# Patient Record
Sex: Male | Born: 1955
Health system: Southern US, Community
[De-identification: ages and names within clinical notes are randomized; demographics above are authoritative.]

## PROBLEM LIST (undated history)

## (undated) DIAGNOSIS — F419 Anxiety disorder, unspecified: Secondary | ICD-10-CM

## (undated) DIAGNOSIS — K219 Gastro-esophageal reflux disease without esophagitis: Secondary | ICD-10-CM

## (undated) DIAGNOSIS — I219 Acute myocardial infarction, unspecified: Secondary | ICD-10-CM

## (undated) DIAGNOSIS — F32A Depression, unspecified: Secondary | ICD-10-CM

## (undated) DIAGNOSIS — F329 Major depressive disorder, single episode, unspecified: Secondary | ICD-10-CM

## (undated) DIAGNOSIS — K635 Polyp of colon: Secondary | ICD-10-CM

## (undated) DIAGNOSIS — Z9889 Other specified postprocedural states: Secondary | ICD-10-CM

## (undated) DIAGNOSIS — I10 Essential (primary) hypertension: Secondary | ICD-10-CM

## (undated) DIAGNOSIS — E7800 Pure hypercholesterolemia, unspecified: Secondary | ICD-10-CM

## (undated) DIAGNOSIS — I2510 Atherosclerotic heart disease of native coronary artery without angina pectoris: Secondary | ICD-10-CM

## (undated) DIAGNOSIS — T7840XA Allergy, unspecified, initial encounter: Secondary | ICD-10-CM

## (undated) HISTORY — DX: Major depressive disorder, single episode, unspecified: F32.9

## (undated) HISTORY — DX: Gastro-esophageal reflux disease without esophagitis: K21.9

## (undated) HISTORY — DX: Anxiety disorder, unspecified: F41.9

## (undated) HISTORY — PX: LEG SURGERY: SHX1003

## (undated) HISTORY — PX: ANGIOPLASTY: SHX39

## (undated) HISTORY — DX: Polyp of colon: K63.5

## (undated) HISTORY — PX: CORONARY ARTERY BYPASS GRAFT: SHX141

## (undated) HISTORY — DX: Allergy, unspecified, initial encounter: T78.40XA

## (undated) HISTORY — DX: Acute myocardial infarction, unspecified: I21.9

## (undated) HISTORY — DX: Depression, unspecified: F32.A

---

## 2003-03-16 ENCOUNTER — Encounter: Payer: Self-pay | Admitting: Gastroenterology

## 2003-03-27 ENCOUNTER — Emergency Department (HOSPITAL_COMMUNITY): Admission: EM | Admit: 2003-03-27 | Discharge: 2003-03-27 | Payer: Self-pay | Admitting: Emergency Medicine

## 2003-08-10 ENCOUNTER — Encounter: Payer: Self-pay | Admitting: Gastroenterology

## 2004-06-28 ENCOUNTER — Emergency Department (HOSPITAL_COMMUNITY): Admission: EM | Admit: 2004-06-28 | Discharge: 2004-06-28 | Payer: Self-pay | Admitting: Emergency Medicine

## 2004-07-10 ENCOUNTER — Encounter: Admission: RE | Admit: 2004-07-10 | Discharge: 2004-07-10 | Payer: Self-pay | Admitting: Cardiology

## 2005-03-12 ENCOUNTER — Emergency Department (HOSPITAL_COMMUNITY): Admission: EM | Admit: 2005-03-12 | Discharge: 2005-03-13 | Payer: Self-pay | Admitting: Emergency Medicine

## 2005-03-14 ENCOUNTER — Emergency Department (HOSPITAL_COMMUNITY): Admission: EM | Admit: 2005-03-14 | Discharge: 2005-03-14 | Payer: Self-pay | Admitting: Emergency Medicine

## 2005-03-20 ENCOUNTER — Encounter (HOSPITAL_COMMUNITY): Admission: RE | Admit: 2005-03-20 | Discharge: 2005-06-18 | Payer: Self-pay | Admitting: Cardiology

## 2005-05-07 ENCOUNTER — Inpatient Hospital Stay (HOSPITAL_COMMUNITY): Admission: EM | Admit: 2005-05-07 | Discharge: 2005-05-09 | Payer: Self-pay | Admitting: Emergency Medicine

## 2005-06-10 ENCOUNTER — Observation Stay (HOSPITAL_COMMUNITY): Admission: RE | Admit: 2005-06-10 | Discharge: 2005-06-11 | Payer: Self-pay | Admitting: *Deleted

## 2005-06-25 ENCOUNTER — Encounter: Payer: Self-pay | Admitting: Emergency Medicine

## 2005-06-26 ENCOUNTER — Observation Stay (HOSPITAL_COMMUNITY): Admission: EM | Admit: 2005-06-26 | Discharge: 2005-06-27 | Payer: Self-pay | Admitting: Internal Medicine

## 2005-08-07 ENCOUNTER — Encounter (HOSPITAL_COMMUNITY): Admission: RE | Admit: 2005-08-07 | Discharge: 2005-11-05 | Payer: Self-pay | Admitting: Cardiology

## 2005-10-30 ENCOUNTER — Encounter (INDEPENDENT_AMBULATORY_CARE_PROVIDER_SITE_OTHER): Payer: Self-pay | Admitting: *Deleted

## 2005-10-30 ENCOUNTER — Ambulatory Visit (HOSPITAL_COMMUNITY): Admission: RE | Admit: 2005-10-30 | Discharge: 2005-10-30 | Payer: Self-pay | Admitting: *Deleted

## 2005-11-06 ENCOUNTER — Encounter (HOSPITAL_COMMUNITY): Admission: RE | Admit: 2005-11-06 | Discharge: 2005-12-22 | Payer: Self-pay | Admitting: Cardiology

## 2006-01-06 ENCOUNTER — Encounter (INDEPENDENT_AMBULATORY_CARE_PROVIDER_SITE_OTHER): Payer: Self-pay | Admitting: *Deleted

## 2006-01-06 ENCOUNTER — Ambulatory Visit (HOSPITAL_COMMUNITY): Admission: RE | Admit: 2006-01-06 | Discharge: 2006-01-06 | Payer: Self-pay | Admitting: *Deleted

## 2006-01-16 ENCOUNTER — Encounter: Admission: RE | Admit: 2006-01-16 | Discharge: 2006-01-16 | Payer: Self-pay | Admitting: Internal Medicine

## 2006-02-17 ENCOUNTER — Encounter (HOSPITAL_COMMUNITY): Admission: RE | Admit: 2006-02-17 | Discharge: 2006-04-27 | Payer: Self-pay | Admitting: Cardiology

## 2006-04-17 ENCOUNTER — Encounter: Admission: RE | Admit: 2006-04-17 | Discharge: 2006-04-17 | Payer: Self-pay | Admitting: Internal Medicine

## 2006-05-26 DIAGNOSIS — I219 Acute myocardial infarction, unspecified: Secondary | ICD-10-CM

## 2006-05-26 HISTORY — DX: Acute myocardial infarction, unspecified: I21.9

## 2006-10-29 ENCOUNTER — Encounter: Admission: RE | Admit: 2006-10-29 | Discharge: 2006-10-29 | Payer: Self-pay | Admitting: Internal Medicine

## 2007-02-04 ENCOUNTER — Encounter (INDEPENDENT_AMBULATORY_CARE_PROVIDER_SITE_OTHER): Payer: Self-pay | Admitting: Internal Medicine

## 2007-02-04 ENCOUNTER — Observation Stay (HOSPITAL_COMMUNITY): Admission: AD | Admit: 2007-02-04 | Discharge: 2007-02-05 | Payer: Self-pay | Admitting: Internal Medicine

## 2007-02-24 ENCOUNTER — Ambulatory Visit (HOSPITAL_COMMUNITY): Admission: RE | Admit: 2007-02-24 | Discharge: 2007-02-24 | Payer: Self-pay | Admitting: Internal Medicine

## 2008-07-19 ENCOUNTER — Encounter (INDEPENDENT_AMBULATORY_CARE_PROVIDER_SITE_OTHER): Payer: Self-pay | Admitting: *Deleted

## 2008-12-20 ENCOUNTER — Encounter (INDEPENDENT_AMBULATORY_CARE_PROVIDER_SITE_OTHER): Payer: Self-pay | Admitting: *Deleted

## 2008-12-20 ENCOUNTER — Encounter: Payer: Self-pay | Admitting: Gastroenterology

## 2009-02-16 ENCOUNTER — Ambulatory Visit: Payer: Self-pay | Admitting: Gastroenterology

## 2009-02-20 ENCOUNTER — Encounter: Payer: Self-pay | Admitting: Gastroenterology

## 2009-02-20 ENCOUNTER — Telehealth (INDEPENDENT_AMBULATORY_CARE_PROVIDER_SITE_OTHER): Payer: Self-pay | Admitting: *Deleted

## 2009-02-28 ENCOUNTER — Telehealth (INDEPENDENT_AMBULATORY_CARE_PROVIDER_SITE_OTHER): Payer: Self-pay | Admitting: *Deleted

## 2009-03-05 ENCOUNTER — Encounter: Payer: Self-pay | Admitting: Gastroenterology

## 2009-03-15 ENCOUNTER — Telehealth (INDEPENDENT_AMBULATORY_CARE_PROVIDER_SITE_OTHER): Payer: Self-pay | Admitting: *Deleted

## 2009-03-22 ENCOUNTER — Encounter: Payer: Self-pay | Admitting: Gastroenterology

## 2009-03-22 ENCOUNTER — Ambulatory Visit (HOSPITAL_COMMUNITY): Admission: RE | Admit: 2009-03-22 | Discharge: 2009-03-22 | Payer: Self-pay | Admitting: Gastroenterology

## 2009-03-27 ENCOUNTER — Encounter: Payer: Self-pay | Admitting: Gastroenterology

## 2009-03-28 ENCOUNTER — Ambulatory Visit: Payer: Self-pay | Admitting: Gastroenterology

## 2009-12-07 ENCOUNTER — Telehealth: Payer: Self-pay | Admitting: Gastroenterology

## 2010-06-16 ENCOUNTER — Encounter: Payer: Self-pay | Admitting: Internal Medicine

## 2010-06-16 ENCOUNTER — Encounter: Payer: Self-pay | Admitting: Cardiology

## 2010-06-25 NOTE — Progress Notes (Signed)
Summary: Schedule colonoscopy  Phone Note Outgoing Call Call back at Home Phone [phone]   Call placed by: Harlow Mares CMA Duncan Dull),  December 07, 2009 4:57 PM Call placed to: Patient Summary of Call: Patients number is disconnected, we will mail them a letter to remind them they are due for their procedure and they need to call back and schedule. patient needs office visit before colonoscopy due to plavix  Initial call taken by: Harlow Mares CMA Duncan Dull),  December 07, 2009 4:57 PM

## 2010-10-08 NOTE — H&P (Signed)
[name], [name] NO.:  [id number]   MEDICAL RECORD NO.:  [id number]          PATIENT TYPE:  EMS   LOCATION:  ED                           FACILITY:  [facility]   PHYSICIAN:  Elliot Cousin, M.D.    DATE OF BIRTH:  [birth year]   DATE OF ADMISSION:  02/03/2007  DATE OF DISCHARGE:                              HISTORY & PHYSICAL   PRIMARY CARE PHYSICIAN:  Jaclyn Prime. [name], M.D.   CHIEF COMPLAINT:  I have a certain feeling in my chest.  The patient  also complains of mild shortness of breath and questionable  palpitations.   HISTORY OF PRESENT ILLNESS:  The patient is a 55 year old man with a  past medical history significant for coronary artery disease, status  post three-vessel CABG in August 2006, gastroesophageal reflux disease,  and generalized anxiety.  He presents to the emergency department with  the chief complaint of a certain feeling in my chest.  The patient  says that it is difficult to describe the feeling in his chest.  He says  that the discomfort is not like when he suffered a heart attack in  August of 2006.  The sensation is a discomfort and not an outright pain.  It is not a pressure-like pain.  It is associated with mild shortness of  breath and what he believes may be palpitations.  The discomfort is  located substernally and it does not radiate.  It is not associated with  nausea, vomiting, diaphoresis, pleurisy, or radiation.  He has had these  episodes intermittently over the past week.  He admits to an increase in  stress at home and work.  He also confirms that he has heartburn  although he says that these episodes do not feel as if they are  heartburn.  The patient denies any illicit drug use.  He denies  excessive caffeine intake.   During the evaluation in the emergency department, the patient is noted  to be hemodynamically stable.  His EKG reveals normal sinus rhythm with  a heart rate of 80 beats per minute and no acute ST-T wave  abnormalities.  His initial cardiac markers are negative.  His chest x-  ray reveals no active disease.  However, given his risk factors, he will  be admitted for further evaluation and management.   PAST MEDICAL HISTORY:  1. Coronary artery disease with a history of three-vessel CABG in      August of 2006.  2. Status post angioplasty and stent to the saphenous vein graft to      the obtuse marginal in January 2007 by Dr. Jenne Campus.  3. Gastroesophageal reflux disease.  4. Dyslipidemia.  5. History of hypotension secondary to medication.  6. Depression.  7. Anxiety.  8. Umbilicated nodule of the gastric body (reactive gastropathy) per      EGD by Dr. Virginia Rochester in June 2007.  9. Ascending colon polyp and internal hemorrhoids per colonoscopy in      August 2007 by Dr. Virginia Rochester.   MEDICATIONS:  1. Plavix 75 mg daily.  2. Lipitor 20 mg daily.  3. Prevacid 30 mg daily.  4. Bisoprolol fumarate 2.5 mg daily.  5. Isosorbide mononitrate ER 30 mg daily.  6. Xanax 0.5 mg as needed.  7. Multivitamin once daily.  8. Aspirin 325 mg daily.  9. Clonazepam 0.5 mg daily.  10.Nitroglycerin as needed.  11.Lexapro 10 mg daily.   ALLERGIES:  No known drug allergies.   SOCIAL HISTORY:  The patient is divorced.  [place]  He has two children.  He is employed by [employer].  He  stopped smoking in 2006.  He denies alcohol and illicit drug use.   FAMILY HISTORY:  His mother is 78 years of age and has no chronic  medical conditions.  His father died of melanoma in his 73s.  He has two  brothers who have coronary artery disease.   REVIEW OF SYSTEMS:  The patient's review of systems is positive an  increase in stress, heartburn; otherwise review of systems is negative.   REVIEW OF SYSTEMS:  The patient's review of systems is otherwise  negative.   PHYSICAL EXAMINATION:  VITAL SIGNS:  Temperature 96.7, blood pressure  94/61, pulse 73, respiratory rate 18, oxygen saturation 100%  on room  air.  GENERAL:  The patient is a pleasant, 55 year old, alert Caucasian male  who is currently sitting up in bed in no acute distress.  HEENT:  Head is normocephalic, atraumatic.  Pupils are equal, round and  reactive to light.  Extraocular movements are intact.  Conjunctivae  clear.  Sclerae white.  Nasal mucosa is mildly dry.  Oropharynx reveals  mildly dry mucous membranes.  No posterior exudates or erythema.  NECK:  Supple.  No adenopathy.  No thyromegaly.  No bruits.  No JVD.  LUNGS:  Clear to auscultation bilaterally.  HEART:  S1 and S2.  No murmurs, rubs or gallops.  CHEST:  Chest wall has a well-healed sternotomy scar, nontender.  ABDOMEN:  Positive bowel sounds.  Soft, nontender, nondistended.  No  hepatosplenomegaly.  No masses palpated.  EXTREMITIES:  The patient's pedal pulses are 2+ bilaterally.  No  pretibial edema.  No pedal edema.  NEUROLOGIC:  The patient is alert and oriented x3.  Cranial nerves II-  XII intact.  Strength is 5/5 throughout.  Sensation is intact.  PSYCHOLOGICAL:  The patient is alert and oriented.  He is cooperative.  His affect is pleasant.  No signs of anxiety currently.   ADMISSION LABORATORY DATA:  BNP less than 30.  CK-MB less than 1.  Troponin-I less than 0.05, myoglobin 5.8.  sodium 139, potassium 3.9,  chloride 105, CO2 28, glucose 92, BUN 9, creatinine 0.72.  Total  bilirubin 1.9, alkaline phosphatase 93,  SGOT 20, SGPT 25, total protein  7.2, albumin 4, calcium 9.2.  D-dimer less than 0.22.  WBC 10.4,  hemoglobin 14.7, platelets 185.   ASSESSMENT:  Chest discomfort with associated shortness of breath and  possible palpitations.  Given the symptomatology, the patient's  presentation is less likely secondary to an acute coronary event.  Will  consider an increase in anxiety as it relates to increased  stress.  The  patient's EKG is within normal limits.  His chest x-ray reveals no  active disease.  His initial cardiac markers are  negative.  However,  given his extensive cardiac history, he will be admitted to rule out  myocardial infarction.   PLAN:  1. The patient will be admitted for further evaluation and management.  2. Will continue cardiac  medications.  However, will hold the beta      blocker for a systolic blood pressure less than or equal to 95 and      a heart rate less than 58.  3. Will continue chronic anxiety medications.  4. For further evaluation, will check cardiac enzymes, TSH, and a 2-D      echocardiogram.  Will also check a followup EKG in the morning.      Elliot Cousin, M.D.  Electronically Signed     DF/MEDQ  D:  02/04/2007  T:  02/04/2007  Job:  [id number]   cc:   Jaclyn Prime. [name], M.D.  Fax: [phone]

## 2010-10-11 NOTE — Discharge Summary (Signed)
[name], [name]               ACCOUNT NO.:  [id number]   MEDICAL RECORD NO.:  [id number]          PATIENT TYPE:  INP   LOCATION:  3738                         FACILITY:  [facility]   PHYSICIAN:  Hillery Aldo, M.D.   DATE OF BIRTH:  09-05-1955   DATE OF ADMISSION:  06/26/2005  DATE OF DISCHARGE:  06/27/2005                                 DISCHARGE SUMMARY   PRIMARY CARE PHYSICIAN:  Antionette Char, MD.   Please send a priority fax of this Discharge Summary to Dr. Adelene Idler  office.   DISCHARGE DIAGNOSES:  1.  Atypical chest pain.  2.  History of coronary artery disease status post coronary artery bypass      graft and recent percutaneous transluminal coronary angioplasty and      stenting.  3.  Dyslipidemia.  4.  Hypertension.  5.  Gastroesophageal reflux disease.  6.  Anxiety.   DISCHARGE MEDICATIONS:  1.  Multivitamin daily.  2.  Aspirin 325 mg daily.  3.  Lipitor 20 mg daily.  4.  Prilosec OTC 20 mg daily.  5.  Bisoprolol 2.5 mg daily.  6.  Plavix 75 mg daily.  7.  Propranolol 20 mg p.r.n.  8.  Vicodin 5/500 1-2 q.6 hours p.r.n. (#20 dispensed with no refills).   CONSULTATIONS:  Telephone consultation made with Dr. Aleen Campi and Dr.  Jenne Campus.   PROCEDURES AND DIAGNOSTIC STUDIES:  Chest x-ray on June 25, 2005, showed  no acute findings.  The heart size and mediastinal contours were within  normal limits.  Both lungs were clear.   DISCHARGE LABORATORY VALUES:  Cardiac markers were negative x3.  C. reactive  protein was 0.9.  ESR was 11.  D-dimer was less than 0.22.  BNP was less  than 30.   BRIEF ADMISSION HPI:  The patient is a 55 year old male who has a past  medical history of coronary artery disease status post three vessel bypass  in August, 2006, and a recent PTCA with stenting done approximately 2 weeks  ago by Dr. Jenne Campus.  He presents with very atypical chest pain described as  occurring in short intermittent spurts and being sharp in nature.  There  was  no association with nausea, vomiting, diaphoresis or shortness of breath.  He was admitted for acute MI rule out.   HOSPITAL COURSE BY PROBLEM:  1.  Atypical chest pain:  The patient did have intermittent chest pains      while here in the hospital.  An EKG was obtained during one of these      episodes which did not reveal any ST or T wave changes.  All of his      cardiac markers were negative.  There was no evidence of acute pulmonary      embolism given his normal D-dimer.  There is no evidence of an      inflammatory process such as pericarditis, given his normal D-dimer.      There is no evidence of an inflammatory process such as pericarditis,      given his normal CRP and ESR.  The patient  was reassured that his chest      pain was not likely due to cardiac issues and was encouraged to followup      with an appointment with Mer[facility] on Monday,      June 30, 2005, at 10:30 a.m.  He will be given a prescription for      Vicodin to use p.r.n. for chest pain, which is likely secondary to      musculoskeletal issues.  2.  Hyperlipidemia.  The patient was continued on his usual dose of Statin.  3.  Anxiety:  The patient did display some anxious behavior during the      course of his hospitalization.  If this persists in being a problem, he      might benefit from a psychiatric referral.   DISPOSITION:  The patient will be discharged home.  Followup has been  arranged.  He is instructed to consume a heart healthy diet and to resume  his activities as tolerated.   CONDITION:  The patient's condition at discharge is improved.           ______________________________  Hillery Aldo, M.D.     CR/MEDQ  D:  06/27/2005  T:  06/27/2005  Job:  [id number]

## 2010-10-11 NOTE — Op Note (Signed)
NAMESUHAAN, [name]               ACCOUNT NO.:  [id number]   MEDICAL RECORD NO.:  [id number]          PATIENT TYPE:  AMB   LOCATION:  ENDO                         FACILITY:  [facility]   PHYSICIAN:  Georgiana Spinner, M.D.    DATE OF BIRTH:  1955-11-26   DATE OF PROCEDURE:  DATE OF DISCHARGE:                                 OPERATIVE REPORT   PROCEDURE:  Colonoscopy.   INDICATIONS:  Colon polyp, colon cancer screening.   ANESTHESIA:  Demerol 90 mg, Versed 10 mg.   PROCEDURE:  With the patient mildly sedated in the left lateral decubitus  position, a rectal exam was performed which was unremarkable.  Subsequently  the Olympus videoscopic colonoscope was inserted the rectum and passed under  direct vision to the cecum identified by ileocecal valve and appendiceal  orifice both of which were photographed.  From this point the colonoscope  was slowly withdrawn taking circumferential views of colonic mucosa stopping  one fold removed from the ileocecal valve.  It appeared thickened to me and  may well been normal but I elected to biopsy just to make sure it was not an  adenomatous polyp and from this point, as stated, the colonoscope was then  withdrawn taking circumferential views remaining colonic mucosa stopping in  the rectum which appeared normal on direct and showed hemorrhoids on  retroflexed view the endoscope was straightened, withdrawn.  The patient's  vital signs, pulse oximeter remained stable.  The patient tolerated  procedure well without apparent complications.   FINDINGS:  Question of polyp in the ascending colon biopsied.  Internal  hemorrhoids otherwise unremarkable colonoscopic examination to the cecum.   PLAN:  Await biopsy report.  The patient will call me for results and follow-  up with me as an outpatient.           ______________________________  Georgiana Spinner, M.D.     GMO/MEDQ  D:  01/06/2006  T:  01/06/2006  Job:  [id number]

## 2010-10-11 NOTE — H&P (Signed)
NAMEARTUR, [name]               ACCOUNT NO.:  [id number]   MEDICAL RECORD NO.:  [id number]          PATIENT TYPE:  INP   LOCATION:  3711                         FACILITY:  [facility]   PHYSICIAN:  Hillery Aldo, M.D.   DATE OF BIRTH:  1955/08/18   DATE OF ADMISSION:  05/07/2005  DATE OF DISCHARGE:                                HISTORY & PHYSICAL   PRIMARY CARE PHYSICIAN:  Dr. [name].   CHIEF COMPLAINTS:  Chest pain.   HISTORY OF PRESENT ILLNESS:  The patient is a 55 year old male with a past  medical history of coronary artery disease status post three-vessel CABG who  has been involved in cardiac rehabilitation. The patient also has a history  of tachycardia and takes propranolol p.r.n. for palpitations. The patient  reported that he had palpitations yesterday and took propranolol, and, when  he went to the cardiac rehab facility today, he was given less of a workout  than normal due to having to take propranolol the previous day. The patient  felt fine during his workout and developed left-sided pressure-type chest  pain while driving home. We reported the chest tightness as being 7/10. He  then decided to drive back to the rehabilitation facility, and, while there,  was given sublingual nitroglycerin x3 which brought the chest tightness down  to a level 1 out of 10. His initial blood pressure during that episode was  124/80 and this came down to 102/70 after the third nitroglycerin. The  patient denies any radiation of the pain, diaphoresis, nausea, or other  symptoms. He did get mildly short of breath but stated that the chest  tightness made him somewhat anxious.   ALLERGIES:  No known drug allergies.   MEDICATIONS:  1.  Lipitor 20 milligrams daily.  2.  Digoxin 0.125 milligrams daily.  3.  Propranolol 20 milligrams p.r.n. tachycardia.  4.  Multivitamin daily.  5.  Zantac 75 milligrams daily.  6.  Aspirin 325 milligrams daily.   PAST MEDICAL HISTORY:  1.  Coronary  artery disease status post three-vessel CABG in August of 2006.  2.  Dyslipidemia.  3.  Gastroesophageal reflux disease status post EGD with dilatation of the      esophagus approximately 2 years ago.   FAMILY HISTORY:  The patient has a mother who is alive at age 79 and  healthy. Father is deceased at age 4 from complications of skin cancer. He  has two brothers that experienced early MIs. He has a sister who is healthy.   SOCIAL HISTORY:  Patient is divorced.  He previously worked for [employer] as  a Merchandiser, retail, but has not worked since his bypass surgery. He is a former  smoker, having quit at the time of bypass in August of 2006. Prior to this,  he smoked a third to a quarter pack a day for 10 years. Denies any alcohol  since the surgery. No drugs.   REVIEW OF SYSTEMS:  No fever, chills, or recent weight changes. No  dysphagia. Has had mild shortness of breath with episodes described above.  No cough. Chest pain  as noted above. No palpitations today. No change in  bowel habits, melena or hematochezia. No dysuria. He reports occasional  headaches.  He has had diminished energy since his bypass surgery.   PHYSICAL EXAMINATION:  VITAL SIGNS:  Temperature 97.3, pulse 80,  respirations 16, blood pressure 114/60.  GENERAL:  Well-developed, well-nourished male in no acute distress.  HEENT: Normocephalic and atraumatic.  Pupils equal, round and reactive to  light.  Extraocular movements intact.  Oropharynx clear.  NECK:  Supple.  No thyromegaly, no lymphadenopathy, no jugular venous  distension.  CHEST:  Lungs were clear to auscultation bilaterally with good air movement.  HEART:  Regular rate and rhythm. No murmurs, rubs or gallops.  ABDOMEN:  Soft, nontender, distended. Normoactive bowel sounds.  EXTREMITIES:  No clubbing, edema or cyanosis.  SKIN:  Warm and dry. No rashes.   EKG:  No ST changes. He has normal sinus rhythm.   CHEST X-RAY:  No acute findings.   LABORATORY DATA:   CBC shows a white blood cell count of 7.9, hemoglobin  16.1, hematocrit 45.4 and platelets 214. Sodium 140, potassium 4.1, chloride  108, bicarbonate 26, BUN 9, creatinine 0.8, glucose 97. Point care markers  were negative x2.   ASSESSMENT/PLAN:  1.  Chest pain.  The patient has significant risk factors for an acute      coronary event, given his recent coronary artery bypass surgery. We will      admit the patient and rule out acute myocardial infarction with serial      enzymes and EKG.  If his enzymes are negative, it would be reasonable to      set him up for follow-up with his cardiologist, Dr. [name]. No evidence      of pulmonary pathology on chest x-ray. We will also check a thyroid      stimulating hormone level to rule out hyperthyroidism.  2.  Hyperlipidemia. Will check a fasting lipid panel in the morning to be      sure he is appropriately managed on his current dose of Lipitor.  3.  Gastroesophageal reflux disease.  Continue the patient on proton pump      inhibitor.  4.  Prophylaxis.  Will use PAS hose for deep vein thrombosis prophylaxis.           ______________________________  Hillery Aldo, M.D.     CR/MEDQ  D:  05/07/2005  T:  05/08/2005  Job:  [id number]   cc:   Jaclyn Prime. [name], M.D.  Fax: [phone]

## 2010-10-11 NOTE — Discharge Summary (Signed)
[name], [name] NO.:  [id number]   MEDICAL RECORD NO.:  [id number]          PATIENT TYPE:  INP   LOCATION:  3711                         FACILITY:  [facility]   PHYSICIAN:  Michaelyn Barter, M.D. DATE OF BIRTH:  1955-11-11   DATE OF ADMISSION:  05/07/2005  DATE OF DISCHARGE:  05/09/2005                                 DISCHARGE SUMMARY   PRIMARY CARE PHYSICIAN:  Jaclyn Prime. [name], M.D.   FINAL DIAGNOSES:  1.  Chest pain.  2.  Dyslipidemia.   CONSULTATIONS:  Cardiology with Dr. Aggie Cosier.   PROCEDURES:  X-ray of chest, May 07, 2005.   HISTORY OF PRESENT ILLNESS:  [name] is a 55 year old gentleman who  arrived with the chief complaint of chest pain.  He stated that he had  experienced palpitations the day prior to this admission and took  propranolol.  He participates in cardiac rehabilitation and went to the  cardiac rehab facility earlier the day of admission.  His workout had been  decreased secondary to taking propranolol the previous day.  He was able to  complete his workout without any complications, however, while driving home  he experienced left sided chest pressure.  The chest tightness was described  as a 7 out of 10 in intensity.  He turned around and returned to the  rehabilitation facility where he was provided sublingual nitroglycerin X3.  His chest discomfort decreased to a 1 out of 10.  Likewise, his blood  pressure also decreased.  The patient denied having any radiation of the  pain, no diaphoresis, no nausea.  There was some mild shortness of breath  that accompanied the episode.   PAST MEDICAL HISTORY:  For past medical history please see that dictated by  Dr. Lonia Blood on May 07, 2005.   HOSPITAL COURSE:  PROBLEM #1:  CHEST PAIN:  The patient was admitted to rule  out acute coronary syndrome.  He had an x-ray completed on May 07, 2005  with the final impression there was no acute disease.  Given his extensive  cardiac history, cardiology was consulted and Dr. [name] saw the patient on  May 08, 2005.  His note indicated that he was going to plan for a  catheterization in his office next week and later in the day the patient was  discharged home.  On the date of discharge, May 09, 2005 he stated that  his chest pain had declined to 2 out of 10.  He denied having any additional  complaints.   PROBLEM #2:  DYSLIPIDEMIA:  This was monitored over the course of the  patient's hospitalization.   PROBLEM #3:  HYPOTENSION:  It was noted that the patient's blood pressure  was on the low side on May 08, 2005 at 89/51 and by May 09, 2005  it was 93/59.  It was felt that the patient may have been slightly volume  depleted therefore fluids were provided to the patient.  The patient  otherwise was asymptomatic.   PLAN:  The plan was to discharge the patient home on May 09, 2005.  He  stated that his chest pain had completely resolved with no shortness of  breath and he said he felt ready to be discharged home.   DISCHARGE MEDICATIONS:  The patient was instructed to resume all of his  previously prescribed home medications.   FOLLOW UP:  He was instructed to follow up with Dr. Aggie Cosier within the  next week.      Michaelyn Barter, M.D.  Electronically Signed     OR/MEDQ  D:  07/22/2005  T:  07/23/2005  Job:  [id number]   cc:   Jaclyn Prime. [name], M.D.  Fax: [phone]

## 2010-10-11 NOTE — H&P (Signed)
NAMETYROME, DONATELLI NO.:  [id number]   MEDICAL RECORD NO.:  [id number]          PATIENT TYPE:  EMS   LOCATION:  ED                           FACILITY:  [facility]   PHYSICIAN:  Hettie Holstein, D.O.    DATE OF BIRTH:  02-28-1956   DATE OF ADMISSION:  06/25/2005  DATE OF DISCHARGE:                                HISTORY & PHYSICAL   PRIMARY CARE PHYSICIAN:  Antionette Char, M.D.   CARDIOLOGIST:  Recent cardiologist, Dr. Jenne Campus, who placed a stent and  performed PTCA on Mr. Davies.   CHIEF COMPLAINT:  Chest pain.   HISTORY OF PRESENT ILLNESS:  [name] is a pleasant Caucasian male who has  recently undergone stenting and PTCA a couple of weeks ago per Dr. Jenne Campus.  He  has known history of three-vessel coronary artery bypass grafting in  August 2006 and it was discovered he had stent placement in the proximal  vein graft to the obtuse marginal.  There was noted patent SVG to the RCA  and he had been doing fairly well.  But around 4:30 he developed some sharp  pains in his left parasternal area where these have persisted and there was  no pleuritic component to this.  It was non-reproducible and an area of  soreness just anterior to his sternum.  There was no shortness of breath  accompanied his complaint of dyspnea on exertion though he has not exerted  himself since his surgery in August.   In the emergency department he had negative point-of-care markers.  An EKG  was not revealing with acute ischemia.   PAST MEDICAL HISTORY:  1.  CABG x3 in August 2006, status post PTCA and stenting two weeks ago.  2.  History of diabetes.  3.  Hypercholesterolemia.  4.  Hypertension.  5.  Reflux disease.  6.  He has undergone endoscopic evaluation by [facility] for which he      reports only nausea and reflux subsequent to these.   MEDICATIONS:  1.  Plavix 75 mg daily.  2.  Aspirin 325 mg daily.  3.  Propranolol on a p.r.n. basis.  4.  Lipitor 20 mg daily.  5.   Prilosec 20 mg daily.  6.  __________ 2.5 mg daily recently decreased.   ALLERGIES:  No known drug allergies.   SOCIAL HISTORY:  The patient quit tobacco in August.  Does not drink  alcohol.  He is divorced.  Does have children.  Currently off work.   FAMILY HISTORY:  Noncontributory.   REVIEW OF SYSTEMS:  Unremarkable.   PHYSICAL EXAMINATION:  VITAL SIGNS:  Vital signs were stable though slightly  on the low side.  However, he was asymptomatic.  HEENT:  Normocephalic, atraumatic.  Extraocular muscles were intact.  NECK:  Supple, nontender.  CHEST:  Anterior chest wall he does have wire that are palpable status post  CABG.  LUNGS:  Clear to auscultation.  ABDOMEN:  Soft, nontender.  EXTREMITIES:  No edema.  NEUROLOGIC:  The patient is euthymic.  His affect is stable.   LABORATORY DATA:  Point-of-care markers negative.  EKG revealed normal sinus  rhythm with nonspecific ST abnormalities.  Sodium 139, potassium 3.6. BUN  10, creatinine 0.8, glucose 107.  Chest x-ray:  I cannot review this right  now as I cannot access this on the pacs system.   ASSESSMENT:  1.  Atypical chest pain, status post stent percutaneous transluminal      coronary angioplasty two weeks ago.  2.  Known coronary artery disease.  3.  Hypertension.  4.  Gastroesophageal reflux disease.   PLAN:  We will admit Mr. Pryce for 23-hour observation and follow his  clinical course.  Continue with aspirin and most of the medication that he  was on at home and cycle his cardiac markers.  If these are clear, he could  certainly follow up with his primary cardiologist, Dr. Aleen Campi, upon  discharge.      Hettie Holstein, D.O.  Electronically Signed     ESS/MEDQ  D:  06/26/2005  T:  06/26/2005  Job:  [id number]   cc:   Antionette Char, MD  Fax: [phone]   Darlin Priestly, MD  Fax: [phone]

## 2010-10-11 NOTE — Cardiovascular Report (Signed)
NAMEANTARIO, YASUDA               ACCOUNT NO.:  [id number]   MEDICAL RECORD NO.:  [id number]          PATIENT TYPE:  OIB   LOCATION:  6529                         FACILITY:  [facility]   PHYSICIAN:  Darlin Priestly, MD  DATE OF BIRTH:  September 20, 1955   DATE OF PROCEDURE:  06/10/2005  DATE OF DISCHARGE:                              CARDIAC CATHETERIZATION   PROCEDURE:  1.  Saphenous vein graft angiography.  2.  Saphenous vein graft to obtuse marginal - proximal:      1.  Percutaneous transluminal coronary balloon angioplasty.      2.  Placement of intracoronary stent.   ATTENDING PHYSICIAN:  Darlin Priestly, M.D.   COMPLICATIONS:  None.   INDICATIONS FOR PROCEDURE:  Mr. Ly is a 55 year old male patient of Dr.  Charolette Child and Dr. [name] with a history of hypertension, history of  tobacco use, history of coronary artery bypass surgery in August 2006 at [facility] in [place] consisting of a LIMA to LAD, vein graft to  OM, and vein graft to RCA.  The patient had recurrent chest pain with an  abnormal stress test on May 15, 2005, with subsequent cardiac  catheterization by Dr. Charolette Child revealing an atretic LIMA to the LAD,  though the LAD, itself, had non-critical disease.  He did have diffuse  disease of the circumflex system with a patent vein graft to the OM, though  the vein graft was noted to have diffuse 95% proximal stenosis into the  early mid segment.  There was no disease distal to graft insertion.  The RCA  was noted to be totally occluded in its mid portion.  The vein graft to the  RCA was not visualized.  The patient is now brought for percutaneous  intervention of the vein graft to the OM.   DESCRIPTION OF PROCEDURE:  After giving informed written consent, the  patient was brought to the cardiac cath lab were the right and left groins  were shaved, prepped and draped in a sterile fashion.  ECG monitoring was  established.  Using modified  Seldinger technique, a 6 French arterial sheath  was inserted in the right femoral artery.  6 French diagnostic catheters  were used to perform diagnostic angiography.  An RCB catheter was used to  engage the saphenous vein graft to the RCA which was then visualized.  This  graft revealed 50% early mid as well as 40% distal stenosis inserted in the  distal portion of the RCA.  There did not appear to be any significant  disease distal to the graft insertion.  This catheter was then exchanged for  a number 6 French LCB guiding catheter with sideholes.  Following this, a  0.014 ATW marker wire was advanced down the guiding catheter and used to  cross the proximal vein graft stenotic lesion.  Measurements were obtained.  The wire was then positioned in the distal limb without difficulty.  Following this, a Voyager 2.5 by 20 mm balloon was then used to cross the  stenotic lesion and three subsequent inflations to 8 atmospheres was  performed for a total of approximately 1 minute 30 seconds.  Follow up  angiogram revealed good luminal gain.  This balloon was then removed and a  Cypher 2.5 by 28 mm stent was then carefully positioned across the ostium of  the vein graft into the proximal portion of the graft.  The stent was then  deployed at 14 atmospheres for a total of 25 seconds.  A second inflation at  16 atmospheres was then performed for 15 seconds.  Follow up angiogram  revealed no evidence of dissection or thrombus with TIMI III flow to the  distal vessel.  This balloon was then exchanged for a Powersail 2.75 by 8 mm  balloon which was then positioned in the mid portion of the stent.  Three  inflations at 10, 12, and 14 atmospheres was performed throughout the mid,  proximal, and ostial portion of the stent for a total of approximately 1  minute 15 seconds.  Follow up angiogram revealed no evidence of dissection  or thrombus with TIMI III flow to the distal vessel.  IV Integrilin was  used  throughout the case.  IV doses of heparin were given to maintain the ACT  between 200 and 300.  Final orthogonal angiograms revealed less than 10%  residual stenosis in the vein graft to the OM with TIMI III flow to the  distal vessel.  At this point, we elected to conclude the procedure.  All  balloons, wires, and catheters were removed.  The hemostatic sheath was sewn  in place and the patient was transferred back to the ward in stable  condition.   CONCLUSION:  1.  Successful percutaneous transluminal coronary balloon angioplasty and      placement of a Cypher 2.5 by 28 mm stent in the proximal vein graft to      the OM ultimately post dilated to 2.8 mm.  2.  Patent saphenous vein graft to the right coronary artery with long 50%      mid vessel stenosis.  3.  Adjuvant use of Integrilin infusion.      Darlin Priestly, MD  Electronically Signed     RHM/MEDQ  D:  06/10/2005  T:  06/10/2005  Job:  [id number]   cc:   Jaclyn Prime. [name], M.D.  Fax: [phone]   Antionette Char, MD  Fax: [phone]

## 2010-10-11 NOTE — H&P (Signed)
NAMELENVILLE, HIBBERD NO.:  [id number]   MEDICAL RECORD NO.:  [id number]          PATIENT TYPE:  OIB   LOCATION:  2899                         FACILITY:  [facility]   PHYSICIAN:  Darlin Priestly, MD  DATE OF BIRTH:  25-Mar-1956   DATE OF ADMISSION:  06/10/2005  DATE OF DISCHARGE:                                HISTORY & PHYSICAL   HISTORY OF PRESENT ILLNESS:  Mr. [name] is a 55 year old male who is  followed by Dr. Aleen Campi and Dr. [name].  He has history of coronary disease.  He had bypass surgery in [place], [place] in August of 2006.  He  has had recurrent chest pain.  He was cathed at the heart center on May 29, 2005 after an abnormal Cardiolite study.  Catheterization revealed  occlusion of the vein graft to the intermediate, slow flow and small distal  LIMA to the LAD with bidirectional flow from the native vessel, and an EF of  60-70%.  Native coronaries showed diffusely diseased RCA with a 60-80%  stenosis, normal circumflex, 30-40% LAD.  The intermediate branch was  totally occluded.  Dr. Aleen Campi felt that the patient had 2 vessel coronary  disease with total occlusion of the large intermediate branch and critical  diffuse stenosis of the proximal and mid RCA.  He had vein graft disease  with total occlusion of the RCA vein graft and subtotal occlusion of the  intermediate branch vein graft.  The patient has small noncontributing LIMA  to the LAD.  The patient is admitted now for elective intervention. He has  been put on Plavix.  He has had not had recurrent chest pain.  He has been  out of work.   PAST MEDICAL HISTORY:  Remarkable for gastroesophageal reflux.  He has had  previous endoscopy and colonoscopy at [facility].  He denies any history of  diabetes.  He has had hypotension.  He is on a Statin medicine.   ALLERGIES:  He has no known drug allergies.   CURRENT MEDICATIONS:  1.  Lipitor 20 mg a day.  2.  Aspirin 325 mg a day.  3.   Plavix 75 mg a day.  4.  Bisoprolol 15 mg a day.   SOCIAL HISTORY:  He was a Merchandiser, retail for [employer].  He is divorced.  He is  an ex-smoker.  He has 2 children and one grandchild.   FAMILY HISTORY:  Two brothers have had MI's.   REVIEW OF SYSTEMS:  Essentially unremarkable except for noted above.  Please  see complete office note from 05/27/05, for complete details.  He has not  had GI bleeding or melena.  He has not had prostate problems.   PHYSICAL EXAMINATION:  VITAL SIGNS:  Blood pressure 100/60, pulse 58, O2  saturation 94 on room air.  Temperature 97.  GENERAL:  He is a well developed thin male in no acute distress.  HEENT:  Normocephalic.  Extraocular muscles are intact.  Sclerae is  nonicteric.  Lids and conjunctivae without normal limits.  NECK:  Without JVD, without bruits.  CHEST:  Clear to auscultation and percussion.  CARDIAC:  Regular rate and rhythm without obvious murmurs, rubs or gallops.  ABDOMEN:  Nontender.  No hepatosplenomegaly.  EXTREMITIES:  Without edema.  Distal pulses are intact.  NEUROLOGICAL:  Grossly intact.  He is awake, alert, oriented and  cooperative.  Moves all extremities without any deficits.   LABORATORIES:  June 06, 2005, essentially within normal limits with  normal BUN and creatinine and hematology profile.   IMPRESSION:  1.  Coronary disease as described above, status post bypass surgery in      August of 2006 now with graft failure.  2.  Treated hyperlipidemia.  3.  Ex-smoker.  4.  Hypotension.  5.  Gastroesophageal reflux.   PLAN:  The patient is admitted now for elective intervention by Dr. Leanne Chang.      Abelino Derrick, P.A.      Darlin Priestly, MD  Electronically Signed    LKK/MEDQ  D:  06/10/2005  T:  06/10/2005  Job:  [id number]   cc:   Antionette Char, MD  Fax: [phone]

## 2010-10-11 NOTE — Discharge Summary (Signed)
[name], [name]               ACCOUNT NO.:  [id number]   MEDICAL RECORD NO.:  [id number]           PATIENT TYPE:   LOCATION:                               FACILITY:  [facility]   PHYSICIAN:  Darlin Priestly, MD  DATE OF BIRTH:  [birth year]   DATE OF ADMISSION:  06/10/2005  DATE OF DISCHARGE:  06/11/2005                                 DISCHARGE SUMMARY   DISCHARGE DIAGNOSES:  1.  Coronary artery disease with normal Cardiolite.  2.  Coronary artery disease with bypass grafting in August 2006, now with      graft failure.  3.  Angioplasty and stent to the saphenous vein graft to the obtuse marginal      by Dr. Darlin Priestly.  4.  Hyperlipidemia.  5.  Hypotension with stopping of Zebeta.  6.  Gastroesophageal reflux disease.  7.  Ex-smoker.  8.  Thrombocytopenia.   CONDITION ON DISCHARGE:  Improved.   PROCEDURE:  A percutaneous transluminal coronary angiography and stent  deployment of the saphenous vein graft to the obtuse marginal by Dr. Lenise [name] on June 10, 2005.   DISCHARGE MEDICATIONS:  1.  Lipitor 20 mg, one q. evening.  2.  Enteric-coated aspirin 325 mg daily.  3.  Plavix 75 mg daily.  4.  Multivitamin daily.  5.  Nitroglycerin 1/150 sublingual p.r.n. chest pain.  6.  Bisoprolol for now due to low blood pressure.   DISCHARGE INSTRUCTIONS:  1.  A low-fat, low-salt diet.  2.  Wash the right groin catheterization site with soap and water.  Call if      any bleeding, swelling or drainage.  3.  See [name], Dr. Aram Candela [name]'s N.P. on June 25, 2005, at 10      a.m.   HISTORY OF PRESENT ILLNESS:  This 54 year old male followed by Dr. Aleen [name]  and Dr. Jaclyn [name]. [name] with a history of coronary disease.  He had bypass  grafting in [place], [place] in August 2006, and has had recurrent  chest pain.  He [facility] at [facility] on May 29, 2005,  after an abnormal Cardiolite study.  The catheterization revealed an  occlusion of  the vein graft to the intermediate, slow flow and small distal  LIMA to the LAD with bi-directional flow from the native vessel with an  ejection fraction of 60%-70%.  The native course showed diffusely diseased  RCA with 60%-80% stenosis, normal circumflex and 30%-40% LAD.  The  intermediate branch was totally occluded.  Dr. Aleen [name] felt that the  patient had two-vessel coronary artery disease with total occlusion of the  large intermediate branch and critical diffuse stenosis of the proximal and  mid-RCA and a vein graft disease with total occlusion of the RCA vein graft  with sub-total occlusion of the intermediate branch vein graft, with non-  contributing LIMA to the LAD.   PAST MEDICAL HISTORY:  As above, as well as remarkable for reflux disease.  [name] follows him per [name].  No diabetes.  He has had hypertension, and  he is treated  for hyperlipidemia.   ALLERGIES:  No known drug allergies.   OUTPATIENT MEDICATIONS:  1.  Lipitor 20 mg.  2.  Aspirin 325 mg.  3.  Plavix 75 mg.  4.  Zebeta 15 mg.   FAMILY HISTORY/SOCIAL HISTORY/REVIEW OF SYSTEMS:  See the H&P.   DISCHARGE PHYSICAL EXAMINATION:  VITAL SIGNS:  Blood pressure 92/48, pulse  58, respirations 18, temperature 98 degrees.  Oxygen saturation on room air  97%.  HEART:  A regular rate and rhythm.  LUNGS:  Clear.  ABDOMEN:  Positive bowel sounds.  EXTREMITIES:  No edema.  The right groin catheterization site without  hematoma.  Pedal pulses 2+.   LABORATORY DATA:  Post-procedure:  Hemoglobin 14.1, hematocrit 39.2,  platelets low at 130, which is stable, WBC's 6.6.  Chemistry:  Sodium 141,  potassium 3.9, BUN 7, creatinine 1, glucose 90.  Cardiac enzymes:  CK-MB 26  with MB of 0.4.  Negative for any damage.  He was in sinus bradycardia with  a heart rate in the 50's.   HOSPITAL COURSE:  The patient came in electively for PCI and underwent that  without problems, and drug-eluting stent Cypher was placed.  He was  seen the  next morning by Dr. Jenne [name] and was discharged home.   FOLLOWUP:  He will follow up with Dr. Aleen [name].      [name]. [name]      Darlin Priestly, MD  Electronically Signed    LRI/MEDQ  D:  07/17/2005  T:  07/18/2005  Job:  [id number]   cc:   [name] Char, MD  Fax: [phone]

## 2010-10-11 NOTE — Op Note (Signed)
[name], [name]               ACCOUNT NO.:  [id number]   MEDICAL RECORD NO.:  [id number]          PATIENT TYPE:  AMB   LOCATION:  ENDO                         [facility]AC[facility]   PHYSICIAN:  Georgiana Spinner, M.D.    DATE OF BIRTH:  08-12-1955   DATE OF PROCEDURE:  10/30/2005  DATE OF DISCHARGE:                                 OPERATIVE REPORT   PROCEDURE:  Upper endoscopy with biopsy.   INDICATIONS:  Gastroesophageal reflux disease.   ANESTHESIA:  Propofol   PROCEDURE:  With the patient mildly sedated in the left lateral decubitus  position, the Olympus videoscopic endoscope was inserted in the mouth and  passed under direct vision through the esophagus which appeared normal.  There was no evidence of Barrett's esophagus seen.  We entered into the  stomach.  The fundus appeared normal, however, in the body of the stomach  was an umbilicated nodule presumably a pancreatic rest. This was  photographed and biopsied.  We entered through the pylorus into the duodenal  bulb and second portion of the duodenum and both appeared normal.  From this  point, the endoscope was slowly withdrawn taking circumferential views of  the duodenal mucosa until the endoscope had been pulled back into the  stomach and placed in retroflexion to view the stomach from below. The  endoscope was straightened and withdrawn taking circumferential views of the  remaining gastric and esophageal mucosa.  The patient's vital signs and  pulse oximeter remained stable.  The patient tolerated the procedure well  without apparent complications.   FINDINGS:  Umbilicated nodule in the gastric body which is presumably a  pancreatic rest, biopsied, await biopsy report.  The patient will call me  for results and follow-up with me as an outpatient.           ______________________________  Georgiana Spinner, M.D.     GMO/MEDQ  D:  10/30/2005  T:  10/30/2005  Job:  [id number]

## 2011-03-07 LAB — B-NATRIURETIC PEPTIDE (CONVERTED LAB): Pro B Natriuretic peptide (BNP): <30

## 2011-03-07 LAB — DIFFERENTIAL
Basophils Absolute: 0
Basophils Relative: 0
Eosinophils Absolute: 0.1
Eosinophils Relative: 1
Lymphocytes Relative: 20
Lymphs Abs: 2
Monocytes Absolute: 1 — ABNORMAL HIGH
Monocytes Relative: 10
Neutro Abs: 7.2
Neutrophils Relative %: 69

## 2011-03-07 LAB — LIPID PANEL
Cholesterol: 130
HDL: 46
LDL Cholesterol: 71
Total CHOL/HDL Ratio: 2.8
Triglycerides: 64
VLDL: 13

## 2011-03-07 LAB — CBC
HCT: 41.4
Hemoglobin: 14.7
MCHC: 35.4
MCV: 93.7
Platelets: 185
RBC: 4.42
RDW: 12.2
WBC: 10.4

## 2011-03-07 LAB — CARDIAC PANEL(CRET KIN+CKTOT+MB+TROPI)
CK, MB: 0.8
CK, MB: 0.8
CK, MB: 1.1
Relative Index: INVALID
Relative Index: INVALID
Total CK: 33
Total CK: 41
Troponin I: 0.01
Troponin I: 0.02
Troponin I: 0.02

## 2011-03-07 LAB — COMPREHENSIVE METABOLIC PANEL
ALT: 25
AST: 20
Albumin: 4
Alkaline Phosphatase: 93
BUN: 9
CO2: 28
Calcium: 9.2
Chloride: 105
Creatinine, Ser: 0.72
GFR calc Af Amer: >60
GFR calc non Af Amer: >60
Glucose, Bld: 92
Potassium: 3.9
Sodium: 139
Total Bilirubin: 1.9 — ABNORMAL HIGH
Total Protein: 7.2

## 2011-03-07 LAB — TSH
TSH: 1.592
TSH: 1.013

## 2011-03-07 LAB — POCT CARDIAC MARKERS
CKMB, poc: <1 — ABNORMAL LOW
Myoglobin, poc: 45.8
Operator id: 4761
Troponin i, poc: <0.05

## 2011-03-07 LAB — D-DIMER, QUANTITATIVE: D-Dimer, Quant: <0.22

## 2011-03-07 LAB — TROPONIN I: Troponin I: 0.03

## 2011-03-07 LAB — CK TOTAL AND CKMB (NOT AT ARMC)
CK, MB: 1.3
Relative Index: INVALID
Total CK: 51

## 2011-08-16 ENCOUNTER — Other Ambulatory Visit: Payer: Self-pay

## 2011-08-16 ENCOUNTER — Encounter (HOSPITAL_COMMUNITY): Payer: Self-pay | Admitting: Emergency Medicine

## 2011-08-16 ENCOUNTER — Emergency Department (HOSPITAL_COMMUNITY)
Admission: EM | Admit: 2011-08-16 | Discharge: 2011-08-16 | Disposition: A | Discharge status: Home / Self Care | Payer: BC Managed Care – PPO | Attending: Emergency Medicine | Admitting: Emergency Medicine

## 2011-08-16 DIAGNOSIS — R232 Flushing: Secondary | ICD-10-CM | POA: Insufficient documentation

## 2011-08-16 DIAGNOSIS — I2510 Atherosclerotic heart disease of native coronary artery without angina pectoris: Secondary | ICD-10-CM | POA: Insufficient documentation

## 2011-08-16 DIAGNOSIS — R21 Rash and other nonspecific skin eruption: Secondary | ICD-10-CM

## 2011-08-16 HISTORY — DX: Atherosclerotic heart disease of native coronary artery without angina pectoris: I25.10

## 2011-08-16 LAB — URINALYSIS, ROUTINE W REFLEX MICROSCOPIC
Leukocytes, UA: NEGATIVE
Nitrite: NEGATIVE
Specific Gravity, Urine: 1.003 — ABNORMAL LOW (ref 1.005–1.030)
pH: 6.5 (ref 5.0–8.0)

## 2011-08-16 LAB — DIFFERENTIAL
Basophils Absolute: 0 10*3/uL (ref 0.0–0.1)
Basophils Relative: 0 % (ref 0–1)
Eosinophils Relative: 0 % (ref 0–5)
Monocytes Absolute: 1.4 10*3/uL — ABNORMAL HIGH (ref 0.1–1.0)
Neutro Abs: 8.4 10*3/uL — ABNORMAL HIGH (ref 1.7–7.7)

## 2011-08-16 LAB — CBC
HCT: 43.4 % (ref 39.0–52.0)
MCHC: 36.4 g/dL — ABNORMAL HIGH (ref 30.0–36.0)
RDW: 12 % (ref 11.5–15.5)

## 2011-08-16 LAB — BASIC METABOLIC PANEL
Calcium: 9.8 mg/dL (ref 8.4–10.5)
Chloride: 106 mEq/L (ref 96–112)
Creatinine, Ser: 0.61 mg/dL (ref 0.50–1.35)
GFR calc Af Amer: >90 mL/min (ref >90)

## 2011-08-16 LAB — HEPATIC FUNCTION PANEL
Indirect Bilirubin: 0.6 mg/dL (ref 0.3–0.9)
Total Protein: 7.2 g/dL (ref 6.0–8.3)

## 2011-08-16 NOTE — ED Notes (Signed)
MD at bedside. edp webb

## 2011-08-16 NOTE — ED Notes (Signed)
MD at bedside. 

## 2011-08-16 NOTE — ED Notes (Signed)
Pt reports a warming sensation to his face, denies pain

## 2011-08-16 NOTE — ED Notes (Signed)
Pt. Stated, I woke this am and I was flushed my face, chest, arms were all red.  I had a cortisone shot 2 days ago in my knee, but I've had that before.

## 2011-08-16 NOTE — Discharge Instructions (Signed)
Rash A rash is a change in the color or feel of your skin. There are many different types of rashes. You may have other problems along with your rash. HOME CARE  Avoid the thing that caused your rash.   Do not scratch your rash.   You may take cools baths to help stop itching.   Only take medicines as told by your doctor.   Keep all doctor visits as told.  GET HELP RIGHT AWAY IF:   Your pain, puffiness (swelling), or redness gets worse.   You have a fever.   You have new or severe problems.   You have body aches, watery poop (diarrhea), or you throw up (vomit).   Your rash is not better after 3 days.  MAKE SURE YOU:   Understand these instructions.   Will watch your condition.   Will get help right away if you are not doing well or get worse.  Document Released: 10/29/2007 Document Revised: 05/01/2011 Document Reviewed: 02/24/2011 [employer] Patient Information 2012 [employer]. 

## 2011-08-16 NOTE — ED Provider Notes (Signed)
History     CSN: [id number]  Arrival date & time 08/16/11  4098   First MD Initiated Contact with Patient 08/16/11 [phone]      Chief Complaint  Patient presents with  . Allergic Reaction    (Consider location/radiation/quality/duration/timing/severity/associated sxs/prior treatment) HPI  60yoM h/o CAD s/p CABG and subsequent stent placement, pw flushing. Patient is that he woke up this morning with both his face and his chest red in color. He noticed some redness in his arms as well. He states that he's had no history of similar. He states that his blood pressure was elevated this morning more than usual. He denies itching. He denies throat swelling, pain, muffled voice, shortness of breath. He denies new medications or exposures including lotions soaps detergents. There's been no prolonged exposure in the sun. Denies recent illness, fevers. No sick contacts. He had fish- tilapia- for dinner last night. No recent headache, dizziness, chest pain, shortness of breath, abdominal pain, nausea, vomiting, diarrhea. No recent FB in orifices. He went to work and was sent home.    Past Medical History  Diagnosis Date  . Coronary artery disease     Past Surgical History  Procedure Date  . Cardiac surgery     No family history on file.  History  Substance Use Topics  . Smoking status: Former Research scientist (life sciences)  . Smokeless tobacco: Not on file  . Alcohol Use: No      Review of Systems  All other systems reviewed and are negative.   except as noted HPI   Allergies  Review of patient's allergies indicates no known allergies.  Home Medications   Current Outpatient Rx  Name Route Sig Dispense Refill  . ASPIRIN EC 325 MG PO TBEC Oral Take 325 mg by mouth daily.    . ATORVASTATIN CALCIUM 20 MG PO TABS Oral Take 20 mg by mouth every evening.    Marland Kitchen BISOPROLOL-HYDROCHLOROTHIAZIDE 2.5-6.25 MG PO TABS Oral Take 1 tablet by mouth daily.    Marland Kitchen CLOPIDOGREL BISULFATE 75 MG PO TABS Oral Take 75 mg by  mouth daily.    . ADULT MULTIVITAMIN W/MINERALS CH Oral Take 1 tablet by mouth daily.    Marland Kitchen NITROGLYCERIN 0.4 MG SL SUBL Sublingual Place 0.4 mg under the tongue every 5 (five) minutes as needed. For chest pain    . SERTRALINE HCL 50 MG PO TABS Oral Take 25 mg by mouth at bedtime.      BP 100/55  Pulse 64  Temp(Src) 98.1 F (36.7 C) (Oral)  Resp 18  SpO2 96%  Physical Exam  Nursing note and vitals reviewed. Constitutional: He is oriented to person, place, and time. He appears well-developed and well-nourished. No distress.  HENT:  Head: Atraumatic.  Mouth/Throat: Oropharynx is clear and moist. No oropharyngeal exudate.       No oral lesions  Eyes: Conjunctivae are normal. Pupils are equal, round, and reactive to light.  Neck: Neck supple.  Cardiovascular: Normal rate, regular rhythm, normal heart sounds and intact distal pulses.  Exam reveals no gallop and no friction rub.   No murmur heard. Pulmonary/Chest: Effort normal. No stridor. No respiratory distress. He has no wheezes. He has no rales.  Abdominal: Soft. Bowel sounds are normal. There is no tenderness. There is no rebound and no guarding.  Musculoskeletal: Normal range of motion. He exhibits no edema and no tenderness.  Neurological: He is alert and oriented to person, place, and time.  Skin: Skin is warm and dry. There is  erythema.          Facial flushing, neck/ bilateral proximal ue erythema. No warmth. +blanching. No purpura. Negative nikolsky's. No ttp  Min on abd  Psychiatric: He has a normal mood and affect.   TRIAGE EKG:  Date: 08/16/2011  Rate: 66  Rhythm: normal sinus rhythm  QRS Axis: normal  Intervals: normal  ST/T Wave abnormalities: nonspecific T wave changes t wave inv V3, flattening aVL  Conduction Disutrbances:none  Narrative Interpretation:   Old EKG Reviewed: changes noted   ED Course  Procedures (including critical care time)  Labs Reviewed  CBC - Abnormal; Notable for the following:     WBC 11.3 (*)    MCHC 36.4 (*)    All other components within normal limits  DIFFERENTIAL - Abnormal; Notable for the following:    Neutro Abs 8.4 (*)    Monocytes Relative 13 (*)    Monocytes Absolute 1.4 (*)    All other components within normal limits  BASIC METABOLIC PANEL - Abnormal; Notable for the following:    Glucose, Bld 109 (*)    All other components within normal limits  URINALYSIS, ROUTINE W REFLEX MICROSCOPIC - Abnormal; Notable for the following:    Specific Gravity, Urine 1.003 (*)    All other components within normal limits  HEPATIC FUNCTION PANEL   No results found.   1. Rash and nonspecific skin eruption   2. Flushing      MDM  Asx flushing. No supporting clinical history to support pheochromocytoma, mastocytosis, carcinoid syndrome. No recent illness or exposures. No neurologic findings. Appearance not c/w Gerilyn Nestle Johnson/TEN, erythema multiforme. Will check labs. Likely have patient keep a flushing diary and f/u with his PMD at [place].  Pt without progression of sx x 2.5 hours in ED. Continues to remain well appearing and no progression of skin findings (no intervention in ED). Labs unremarkable. Offered CDU observation and declined. Given STRICT precautions for return to the ED, otherwise will f/u with his PMD on Monday for recheck.  Will keep diary in the meantime.        Blair Heys, MD 08/16/11 1102

## 2011-08-16 NOTE — ED Notes (Signed)
Pt reports waking this am w/flushed feeling starting at the face and radiating to pelvic region, pt received a cortisone shot Wednesday for chronic (L) knee pain, pt denies sob, chest pain, back pain, dizziness, change to medication, or new hygiene products. Pt reports receiving a cortisone shot last year w/no adverse reaction, pt also reports elevated BP this am, pt's normal sbp is 100's today was 150, pt face and upper torso is red. Pt ate tilapia and broccoli for dinner last night, hx of the same w/no allergic reaction.

## 2012-02-20 DIAGNOSIS — K3189 Other diseases of stomach and duodenum: Secondary | ICD-10-CM | POA: Insufficient documentation

## 2012-02-27 ENCOUNTER — Encounter: Payer: Self-pay | Admitting: Gastroenterology

## 2012-11-12 ENCOUNTER — Encounter: Payer: Self-pay | Admitting: Gastroenterology

## 2013-03-19 ENCOUNTER — Emergency Department (HOSPITAL_COMMUNITY)
Admission: EM | Admit: 2013-03-19 | Discharge: 2013-03-19 | Disposition: A | Discharge status: Home / Self Care | Payer: BC Managed Care – PPO | Attending: Emergency Medicine | Admitting: Emergency Medicine

## 2013-03-19 ENCOUNTER — Encounter (HOSPITAL_COMMUNITY): Payer: Self-pay | Admitting: Emergency Medicine

## 2013-03-19 ENCOUNTER — Emergency Department (HOSPITAL_COMMUNITY): Payer: BC Managed Care – PPO

## 2013-03-19 DIAGNOSIS — Z7902 Long term (current) use of antithrombotics/antiplatelets: Secondary | ICD-10-CM | POA: Insufficient documentation

## 2013-03-19 DIAGNOSIS — Z87891 Personal history of nicotine dependence: Secondary | ICD-10-CM | POA: Insufficient documentation

## 2013-03-19 DIAGNOSIS — I10 Essential (primary) hypertension: Secondary | ICD-10-CM | POA: Insufficient documentation

## 2013-03-19 DIAGNOSIS — R0989 Other specified symptoms and signs involving the circulatory and respiratory systems: Secondary | ICD-10-CM | POA: Insufficient documentation

## 2013-03-19 DIAGNOSIS — R0600 Dyspnea, unspecified: Secondary | ICD-10-CM

## 2013-03-19 DIAGNOSIS — Z7982 Long term (current) use of aspirin: Secondary | ICD-10-CM | POA: Insufficient documentation

## 2013-03-19 DIAGNOSIS — E7800 Pure hypercholesterolemia, unspecified: Secondary | ICD-10-CM | POA: Insufficient documentation

## 2013-03-19 DIAGNOSIS — I2510 Atherosclerotic heart disease of native coronary artery without angina pectoris: Secondary | ICD-10-CM | POA: Insufficient documentation

## 2013-03-19 DIAGNOSIS — R0609 Other forms of dyspnea: Secondary | ICD-10-CM | POA: Insufficient documentation

## 2013-03-19 DIAGNOSIS — Z79899 Other long term (current) drug therapy: Secondary | ICD-10-CM | POA: Insufficient documentation

## 2013-03-19 DIAGNOSIS — R42 Dizziness and giddiness: Secondary | ICD-10-CM | POA: Insufficient documentation

## 2013-03-19 HISTORY — DX: Pure hypercholesterolemia, unspecified: E78.00

## 2013-03-19 HISTORY — DX: Essential (primary) hypertension: I10

## 2013-03-19 HISTORY — DX: Other specified postprocedural states: Z98.890

## 2013-03-19 LAB — D-DIMER, QUANTITATIVE: D-Dimer, Quant: <0.27 ug/mL-FEU (ref 0.00–0.48)

## 2013-03-19 LAB — BASIC METABOLIC PANEL
Chloride: 101 mEq/L (ref 96–112)
GFR calc Af Amer: >90 mL/min (ref >90)
Potassium: 3.3 mEq/L — ABNORMAL LOW (ref 3.5–5.1)

## 2013-03-19 LAB — CBC
Hemoglobin: 16.1 g/dL (ref 13.0–17.0)
MCHC: 36.7 g/dL — ABNORMAL HIGH (ref 30.0–36.0)
RBC: 4.75 MIL/uL (ref 4.22–5.81)

## 2013-03-19 LAB — POCT I-STAT TROPONIN I: Troponin i, poc: 0 ng/mL (ref 0.00–0.08)

## 2013-03-19 LAB — PRO B NATRIURETIC PEPTIDE: Pro B Natriuretic peptide (BNP): 106.4 pg/mL (ref 0–125)

## 2013-03-19 NOTE — ED Notes (Addendum)
Pt reports he has been under emotional stress this past week then today while he was helping his daughter move he felt like he couldn't catch his breath. states he did not have chest pain but he is concerned because he had open heart surgery in the past and 'soemthing doesn't feel right." also c/o "feeling flushed."

## 2013-03-19 NOTE — ED Provider Notes (Signed)
Care assumed at the change of shift  pending a second Troponin which is neg. He has remained asymptomatic in the ED. Recommend close followup with Dr. Jacinto Halim who is his cardiologist.  Bonnita Levan. Bernette Mayers, MD 03/19/13 1610

## 2013-03-19 NOTE — ED Provider Notes (Signed)
CSN: [id number]     Arrival date & time 03/19/13  1244 History   First MD Initiated Contact with Patient 03/19/13 1253     Chief Complaint  Patient presents with  . Shortness of Breath   (Consider location/radiation/quality/duration/timing/severity/associated sxs/prior Treatment) HPI This 57 year old male is low risk for pulmonary embolism, he has no recent travel trauma or immobilization or history of DVT or PE and no cancer he states about once every 6 months he has a spell for several months of shortness breath otherwise unexplained it has never gone to see the doctor for that, he arrives today asymptomatic after having several minutes of shortness of breath at 1130 with some lightheadedness without vertigo, he had no chest pain no cough no fever he is not short of breath now he is no abdominal pain no bloody stools no syncope no trauma no other concerns. There is no treatment prior to arrival. He feels back to baseline now. Past Medical History  Diagnosis Date  . Coronary artery disease   . History of open heart surgery   . Hypertension   . Hypercholesteremia    Past Surgical History  Procedure Laterality Date  . Cardiac surgery     History reviewed. No pertinent family history. History  Substance Use Topics  . Smoking status: Former Games developer  . Smokeless tobacco: Not on file  . Alcohol Use: No    Review of Systems 10 Systems reviewed and are negative for acute change except as noted in the HPI. Allergies  Review of patient's allergies indicates no known allergies.  Home Medications   Current Outpatient Rx  Name  Route  Sig  Dispense  Refill  . aspirin 81 MG tablet   Oral   Take 81 mg by mouth daily.         Marland Kitchen atorvastatin (LIPITOR) 20 MG tablet   Oral   Take 20 mg by mouth every evening.         . bisoprolol-hydrochlorothiazide (ZIAC) 2.5-6.25 MG per tablet   Oral   Take 1 tablet by mouth daily.         . clopidogrel (PLAVIX) 75 MG tablet   Oral   Take 75  mg by mouth daily.         . Multiple Vitamin (MULITIVITAMIN WITH MINERALS) TABS   Oral   Take 1 tablet by mouth daily.         . nitroGLYCERIN (NITROSTAT) 0.4 MG SL tablet   Sublingual   Place 0.4 mg under the tongue every 5 (five) minutes as needed. For chest pain         . sertraline (ZOLOFT) 50 MG tablet   Oral   Take 25 mg by mouth at bedtime.          BP 110/80  Pulse 70  Temp(Src) 97.8 F (36.6 C) (Oral)  Resp 18  Ht 5\' 5"  (1.651 m)  Wt 160 lb (72.576 kg)  BMI 26.63 kg/m2  SpO2 94% Physical Exam  Nursing note and vitals reviewed. Constitutional:  Awake, alert, nontoxic appearance.  HENT:  Head: Atraumatic.  Eyes: Right eye exhibits no discharge. Left eye exhibits no discharge.  Neck: Neck supple.  Cardiovascular: Normal rate and regular rhythm.   No murmur heard. Pulmonary/Chest: Effort normal and breath sounds normal. No respiratory distress. He has no wheezes. He has no rales. He exhibits no tenderness.  Abdominal: Soft. Bowel sounds are normal. He exhibits no distension. There is no tenderness. There is no rebound  and no guarding.  Musculoskeletal: He exhibits no edema and no tenderness.  Baseline ROM, no obvious new focal weakness.  Neurological:  Mental status and motor strength appears baseline for patient and situation.  Skin: No rash noted.  Psychiatric: He has a normal mood and affect.    ED Course  Procedures (including critical care time) Labs Review Labs Reviewed  BASIC METABOLIC PANEL - Abnormal; Notable for the following:    Potassium 3.3 (*)    Glucose, Bld 114 (*)    All other components within normal limits  CBC - Abnormal; Notable for the following:    MCHC 36.7 (*)    All other components within normal limits  PRO B NATRIURETIC PEPTIDE  D-DIMER, QUANTITATIVE  POCT I-STAT TROPONIN I  POCT I-STAT TROPONIN I   Imaging Review No results found.  EKG Interpretation     Ventricular Rate:  77 PR Interval:  148 QRS  Duration: 132 QT Interval:  400 QTC Calculation: 452 R Axis:   98 Text Interpretation:  Sinus rhythm with Fusion complexes Right bundle branch block Compared to previous tracing Right bundle branch block NOW PRESENT            MDM   1. Dyspnea    I doubt any other EMC precluding discharge at this time including, but not necessarily limited to the following:PE, ACS.    Hurman Horn, MD 03/27/13 2156

## 2013-12-29 ENCOUNTER — Other Ambulatory Visit: Payer: Self-pay | Admitting: *Deleted

## 2013-12-29 DIAGNOSIS — R002 Palpitations: Secondary | ICD-10-CM

## 2013-12-30 ENCOUNTER — Encounter (INDEPENDENT_AMBULATORY_CARE_PROVIDER_SITE_OTHER): Payer: No Typology Code available for payment source

## 2013-12-30 ENCOUNTER — Encounter: Payer: Self-pay | Admitting: Radiology

## 2013-12-30 DIAGNOSIS — R002 Palpitations: Secondary | ICD-10-CM

## 2013-12-30 NOTE — Progress Notes (Signed)
[name], male   DOB: [birth year], 57 y.o.   MRN: [id number] E cardio 30 day monitor applied

## 2015-01-15 ENCOUNTER — Encounter (INDEPENDENT_AMBULATORY_CARE_PROVIDER_SITE_OTHER): Payer: Self-pay

## 2015-01-15 ENCOUNTER — Ambulatory Visit (INDEPENDENT_AMBULATORY_CARE_PROVIDER_SITE_OTHER): Payer: 59 | Admitting: Family

## 2015-01-15 ENCOUNTER — Encounter: Payer: Self-pay | Admitting: Family

## 2015-01-15 VITALS — BP 118/80 | HR 68 | Temp 98.1°F | Resp 18 | Ht 65.0 in | Wt 158.8 lb

## 2015-01-15 DIAGNOSIS — F329 Major depressive disorder, single episode, unspecified: Secondary | ICD-10-CM | POA: Diagnosis not present

## 2015-01-15 DIAGNOSIS — K219 Gastro-esophageal reflux disease without esophagitis: Secondary | ICD-10-CM | POA: Insufficient documentation

## 2015-01-15 DIAGNOSIS — F32A Depression, unspecified: Secondary | ICD-10-CM | POA: Insufficient documentation

## 2015-01-15 DIAGNOSIS — I10 Essential (primary) hypertension: Secondary | ICD-10-CM | POA: Diagnosis not present

## 2015-01-15 NOTE — Assessment & Plan Note (Signed)
Depression stable with current regimen of Zoloft. Takes medication as prescribed and denies adverse side effects. Denies suicidal ideation. Continue current dosage of Zoloft.

## 2015-01-15 NOTE — Patient Instructions (Addendum)
Thank you for choosing [facility].  Summary/Instructions:  Please continue to take your medications as prescribed.   Please follow up for a physical in April 2017.   Very nice to meet you!  Food Choices for Gastroesophageal Reflux Disease When you have gastroesophageal reflux disease (GERD), the foods you eat and your eating habits are very important. Choosing the right foods can help ease the discomfort of GERD. WHAT GENERAL GUIDELINES DO I NEED TO FOLLOW?  Choose fruits, vegetables, whole grains, low-fat dairy products, and low-fat meat, fish, and poultry.  Limit fats such as oils, salad dressings, butter, nuts, and avocado.  Keep a food diary to identify foods that cause symptoms.  Avoid foods that cause reflux. These may be different for different people.  Eat frequent small meals instead of three large meals each day.  Eat your meals slowly, in a relaxed setting.  Limit fried foods.  Cook foods using methods other than frying.  Avoid drinking alcohol.  Avoid drinking large amounts of liquids with your meals.  Avoid bending over or lying down until 2-3 hours after eating. WHAT FOODS ARE NOT RECOMMENDED? The following are some foods and drinks that may worsen your symptoms: Vegetables Tomatoes. Tomato juice. Tomato and spaghetti sauce. Chili peppers. Onion and garlic. Horseradish. Fruits Oranges, grapefruit, and lemon (fruit and juice). Meats High-fat meats, fish, and poultry. This includes hot dogs, ribs, ham, sausage, salami, and bacon. Dairy Whole milk and chocolate milk. Sour cream. Cream. Butter. Ice cream. Cream cheese.  Beverages Coffee and tea, with or without caffeine. Carbonated beverages or energy drinks. Condiments Hot sauce. Barbecue sauce.  Sweets/Desserts Chocolate and cocoa. Donuts. Peppermint and spearmint. Fats and Oils High-fat foods, including [place] fries and potato chips. Other Vinegar. Strong spices, such as black pepper, white  pepper, red pepper, cayenne, curry powder, cloves, ginger, and chili powder. The items listed above may not be a complete list of foods and beverages to avoid. Contact your dietitian for more information. Document Released: 05/12/2005 Document Revised: 05/17/2013 Document Reviewed: 03/16/2013 [facility] Patient Information 2015 [place], [place]. This information is not intended to replace advice given to you by your health care Daouda Lonzo. Make sure you discuss any questions you have with your health care Bradyn Vassey.

## 2015-01-15 NOTE — Assessment & Plan Note (Signed)
GERD is adequately controlled with current dosage of pantoprazole. Does describe increased amount of gas and pressure. Continue current dosage of pantoprazole. Provided information regarding food choices to reduce symptoms. Follow-up for worsening.

## 2015-01-15 NOTE — Progress Notes (Signed)
Subjective:    [name], male    DOB: 07/20/1955, 59 y.o.   MRN: [id number]  Chief Complaint  Patient presents with  . Establish Care    HPI:  [name] is a 59 y.o. male with a PMH of seasonal allergies, depression, GERD, hyperlipidemia, hypertension, and coronary artery disease who presents today in office visit to establish care.    1.) Hypertension - Currently maintained on bisoprolol-hydrochlorothiazide. Takes the medications as prescribed and denies adverse side effects. Does not currently take his blood pressure at home. Last eye exam was about 8-10 years ago exact time unknown.   BP Readings from Last 3 Encounters:  01/15/15 118/80  03/19/13 110/80  08/16/11 100/55    2.) GERD - Currently maintained on pantoprazole. Takes the medication as prescribed and denies adverse side effects. Continues to experience additional burping and some increased gas. Has recently lost about 10 pounds and has noticed some improvements.  3.) Depression/Anxiety - Currently maintained on Zoloft. Takes the medicaitons as prescribed and denies adverse side effects. Indicates that his depression is well controlled at this time and denies suicidal ideations.  No Known Allergies   Outpatient Prescriptions Prior to Visit  Medication Sig Dispense Refill  . aspirin 81 MG tablet Take 81 mg by mouth daily.    Marland Kitchen atorvastatin (LIPITOR) 20 MG tablet Take 80 mg by mouth every evening.     . bisoprolol-hydrochlorothiazide (ZIAC) 2.5-6.25 MG per tablet Take 1 tablet by mouth daily.    . clopidogrel (PLAVIX) 75 MG tablet Take 75 mg by mouth daily.    . Multiple Vitamin (MULITIVITAMIN WITH MINERALS) TABS Take 1 tablet by mouth daily.    . nitroGLYCERIN (NITROSTAT) 0.4 MG SL tablet Place 0.4 mg under the tongue every 5 (five) minutes as needed. For chest pain    . sertraline (ZOLOFT) 50 MG tablet Take 25 mg by mouth at bedtime.     No facility-administered medications prior to visit.      Past Medical History  Diagnosis Date  . Coronary artery disease   . History of open heart surgery   . Hypertension   . Hypercholesteremia   . Depression   . GERD (gastroesophageal reflux disease)   . Allergy      Past Surgical History  Procedure Laterality Date  . Cardiac surgery    . Coronary artery bypass graft      3x  . Leg surgery Left      Family History  Problem Relation Age of Onset  . Healthy Mother   . Skin cancer Father   . Healthy Maternal Grandmother   . Healthy Maternal Grandfather   . Healthy Paternal Grandmother   . Healthy Paternal Grandfather      Social History   Social History  . Marital Status: Divorced    Spouse Name: N/A  . Number of Children: 2  . 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 Smoking status: Former Smoker    Quit date: 05/27/2004  . Smokeless tobacco: Never Used  . Alcohol Use: No  . Drug Use: No  . Sexual Activity: Yes   Other Topics Concern  . Not on file   Social History Narrative   Fun: Football and anything sports related.   Denies religious beliefs effecting health care.     Review of Systems  Constitutional: Negative for diaphoresis.  Eyes:  Negative for changes in vision.   Respiratory: Negative for chest tightness and shortness of breath.   Cardiovascular: Negative for chest pain, palpitations and leg swelling.  Gastrointestinal: Negative for nausea, vomiting, abdominal pain, diarrhea and constipation.  Neurological: Negative for headaches.  Psychiatric/Behavioral: Negative for suicidal ideas, sleep disturbance and dysphoric mood. The patient is not nervous/anxious.       Objective:    BP 118/80 mmHg  Pulse 68  Temp(Src) 98.1 F (36.7 C) (Oral)  Resp 18  Ht 5\' 5"  (1.651 m)  Wt 158 lb 12.8 oz (72.031 kg)  BMI 26.43 kg/m2  SpO2 96% Nursing note and vital signs reviewed.  Physical Exam  Constitutional: He is oriented to  person, place, and time. He appears well-developed and well-nourished. No distress.  Cardiovascular: Normal rate, regular rhythm, normal heart sounds and intact distal pulses.   Pulmonary/Chest: Effort normal and breath sounds normal.  Neurological: He is alert and oriented to person, place, and time.  Skin: Skin is warm and dry.  Psychiatric: He has a normal mood and affect. His behavior is normal. Judgment and thought content normal.       Assessment & Plan:   Problem List Items Addressed This Visit      Cardiovascular and Mediastinum   Essential hypertension - Primary    Hypertension well controlled with current regimen of bisoprolol-hydrochlorothiazide and below goal of 140/90. Denies adverse side effects. Due for an eye exam which will be scheduled independently. Continue current dosage of bisoprolol-hydrochlorothiazide.        Digestive   GERD (gastroesophageal reflux disease)    GERD is adequately controlled with current dosage of pantoprazole. Does describe increased amount of gas and pressure. Continue current dosage of pantoprazole. Provided information regarding food choices to reduce symptoms. Follow-up for worsening.      Relevant Medications   pantoprazole (PROTONIX) 40 MG tablet     Other   Depression    Depression stable with current regimen of Zoloft. Takes medication as prescribed and denies adverse side effects. Denies suicidal ideation. Continue current dosage of Zoloft.

## 2015-01-15 NOTE — Progress Notes (Signed)
Pre visit review using our clinic review tool, if applicable. No additional management support is needed unless otherwise documented below in the visit note. 

## 2015-01-15 NOTE — Assessment & Plan Note (Signed)
Hypertension well controlled with current regimen of bisoprolol-hydrochlorothiazide and below goal of 140/90. Denies adverse side effects. Due for an eye exam which will be scheduled independently. Continue current dosage of bisoprolol-hydrochlorothiazide.

## 2015-06-22 ENCOUNTER — Ambulatory Visit (INDEPENDENT_AMBULATORY_CARE_PROVIDER_SITE_OTHER): Payer: BLUE CROSS/BLUE SHIELD | Admitting: Family

## 2015-06-22 ENCOUNTER — Encounter: Payer: Self-pay | Admitting: Family

## 2015-06-22 VITALS — BP 120/78 | HR 63 | Temp 97.7°F | Resp 16 | Ht 65.0 in | Wt 158.0 lb

## 2015-06-22 DIAGNOSIS — J019 Acute sinusitis, unspecified: Secondary | ICD-10-CM | POA: Insufficient documentation

## 2015-06-22 DIAGNOSIS — J0100 Acute maxillary sinusitis, unspecified: Secondary | ICD-10-CM

## 2015-06-22 MED ORDER — AMOXICILLIN-POT CLAVULANATE 875-125 MG PO TABS: 1.0000 | ORAL_TABLET | Freq: Two times a day (BID) | ORAL | Status: DC

## 2015-06-22 NOTE — Assessment & Plan Note (Signed)
Symptoms and exam consistent with acute bacterial sinusitis. Start Augmentin. Continue over-the-counter medications as needed for symptom relief and supportive care. Follow-up if symptoms worsen or fail to improve.

## 2015-06-22 NOTE — Progress Notes (Signed)
Subjective:    [name], male    DOB: 06-30-1955, 60 y.o.   MRN: [id number]  Chief Complaint  Patient presents with  . Dizziness    has had sinus congestion and thinks it is causing his dizziness x3 weeks, gotten worse over time    HPI:  [name] is a 60 y.o. male who  has a past medical history of Coronary artery disease; History of open heart surgery; Hypertension; Hypercholesteremia; Depression; GERD (gastroesophageal reflux disease); and Allergy. and presents today for an acute office visit.   This is a new problem. Associated symptom of sinus congestion and dizziness has been going on for about 2 weeks .Denies fever. Modifying factors include Tylenol for the headache. Denies any recent antibiotics.   No Known Allergies   Current Outpatient Prescriptions on File Prior to Visit  Medication Sig Dispense Refill  . aspirin 81 MG tablet Take 81 mg by mouth daily.    Marland Kitchen atorvastatin (LIPITOR) 20 MG tablet Take 80 mg by mouth every evening.     . bisoprolol-hydrochlorothiazide (ZIAC) 2.5-6.25 MG per tablet Take 1 tablet by mouth daily.    . clopidogrel (PLAVIX) 75 MG tablet Take 75 mg by mouth daily.    . Multiple Vitamin (MULITIVITAMIN WITH MINERALS) TABS Take 1 tablet by mouth daily.    . nitroGLYCERIN (NITROSTAT) 0.4 MG SL tablet Place 0.4 mg under the tongue every 5 (five) minutes as needed. For chest pain    . pantoprazole (PROTONIX) 40 MG tablet Take 40 mg by mouth daily.    . sertraline (ZOLOFT) 50 MG tablet Take 25 mg by mouth at bedtime.     No current facility-administered medications on file prior to visit.    Review of Systems  Constitutional: Negative for fever and chills.  HENT: Positive for congestion, ear pain, sinus pressure and tinnitus. Negative for sore throat.   Respiratory: Negative for cough, chest tightness and shortness of breath.   Neurological: Positive for headaches.      Objective:    BP 120/78 mmHg  Pulse 63  Temp(Src)  97.7 F (36.5 C) (Oral)  Resp 16  Ht 5\' 5"  (1.651 m)  Wt 158 lb (71.668 kg)  BMI 26.29 kg/m2  SpO2 97% Nursing note and vital signs reviewed.  Physical Exam  Constitutional: He is oriented to person, place, and time. He appears well-developed and well-nourished. No distress.  HENT:  Right Ear: Hearing, tympanic membrane, external ear and ear canal normal.  Left Ear: Hearing, tympanic membrane, external ear and ear canal normal.  Nose: Right sinus exhibits maxillary sinus tenderness. Right sinus exhibits no frontal sinus tenderness. Left sinus exhibits maxillary sinus tenderness. Left sinus exhibits no frontal sinus tenderness.  Mouth/Throat: Uvula is midline, oropharynx is clear and moist and mucous membranes are normal.  Tympanostomy tube noted in left ear  Cardiovascular: Normal rate, regular rhythm, normal heart sounds and intact distal pulses.   Pulmonary/Chest: Effort normal and breath sounds normal.  Neurological: He is alert and oriented to person, place, and time.  Skin: Skin is warm and dry.  Psychiatric: He has a normal mood and affect. His behavior is normal. Judgment and thought content normal.       Assessment & Plan:   Problem List Items Addressed This Visit      Respiratory   Acute sinusitis - Primary    Symptoms and exam consistent with acute bacterial sinusitis. Start Augmentin. Continue over-the-counter medications as needed for symptom relief  and supportive care. Follow-up if symptoms worsen or fail to improve.      Relevant Medications   amoxicillin-clavulanate (AUGMENTIN) 875-125 MG tablet

## 2015-06-22 NOTE — Progress Notes (Signed)
Pre visit review using our clinic review tool, if applicable. No additional management support is needed unless otherwise documented below in the visit note. 

## 2015-06-22 NOTE — Patient Instructions (Signed)
Thank you for choosing [facility].  Summary/Instructions:  Your prescription(s) have been submitted to your pharmacy or been printed and provided for you. Please take as directed and contact our office if you believe you are having problem(s) with the medication(s) or have any questions.  If your symptoms worsen or fail to improve, please contact our office for further instruction, or in case of emergency go directly to the emergency room at the closest medical facility.   General Recommendations:    Please drink plenty of fluids.  Get plenty of rest   Sleep in humidified air  Use saline nasal sprays  Netti pot   OTC Medications:  Decongestants - helps relieve congestion   Flonase (generic fluticasone) or Nasacort (generic triamcinolone) - please make sure to use the "cross-over" technique at a 45 degree angle towards the opposite eye as opposed to straight up the nasal passageway.   Sudafed (generic pseudoephedrine - Note this is the one that is available behind the pharmacy counter); Products with phenylephrine (-PE) may also be used but is often not as effective as pseudoephedrine.   If you have HIGH BLOOD PRESSURE - Coricidin HBP; AVOID any product that is -D as this contains pseudoephedrine which may increase your blood pressure.  Afrin (oxymetazoline) every 6-8 hours for up to 3 days.   Allergies - helps relieve runny nose, itchy eyes and sneezing   Claritin (generic loratidine), Allegra (fexofenidine), or Zyrtec (generic cyrterizine) for runny nose. These medications should not cause drowsiness.  Note - Benadryl (generic diphenhydramine) may be used however may cause drowsiness  Cough -   Delsym or Robitussin (generic dextromethorphan)  Expectorants - helps loosen mucus to ease removal   Mucinex (generic guaifenesin) as directed on the package.  Headaches / General Aches   Tylenol (generic acetaminophen) - DO NOT EXCEED 3 grams (3,000 mg) in a 24  hour time period  Advil/Motrin (generic ibuprofen)   Sore Throat -   Salt water gargle   Chloraseptic (generic benzocaine) spray or lozenges / Sucrets (generic dyclonine)    Sinusitis Sinusitis is redness, soreness, and inflammation of the paranasal sinuses. Paranasal sinuses are air pockets within the bones of your face (beneath the eyes, the middle of the forehead, or above the eyes). In healthy paranasal sinuses, mucus is able to drain out, and air is able to circulate through them by way of your nose. However, when your paranasal sinuses are inflamed, mucus and air can become trapped. This can allow bacteria and other germs to grow and cause infection. Sinusitis can develop quickly and last only a short time (acute) or continue over a long period (chronic). Sinusitis that lasts for more than 12 weeks is considered chronic.  CAUSES  Causes of sinusitis include:  Allergies.  Structural abnormalities, such as displacement of the cartilage that separates your nostrils (deviated septum), which can decrease the air flow through your nose and sinuses and affect sinus drainage.  Functional abnormalities, such as when the small hairs (cilia) that line your sinuses and help remove mucus do not work properly or are not present. SIGNS AND SYMPTOMS  Symptoms of acute and chronic sinusitis are the same. The primary symptoms are pain and pressure around the affected sinuses. Other symptoms include:  Upper toothache.  Earache.  Headache.  Bad breath.  Decreased sense of smell and taste.  A cough, which worsens when you are lying flat.  Fatigue.  Fever.  Thick drainage from your nose, which often is green and may   contain pus (purulent).  Swelling and warmth over the affected sinuses. DIAGNOSIS  Your health care provider will perform a physical exam. During the exam, your health care provider may:  Look in your nose for signs of abnormal growths in your nostrils (nasal  polyps).  Tap over the affected sinus to check for signs of infection.  View the inside of your sinuses (endoscopy) using an imaging device that has a light attached (endoscope). If your health care provider suspects that you have chronic sinusitis, one or more of the following tests may be recommended:  Allergy tests.  Nasal culture. A sample of mucus is taken from your nose, sent to a lab, and screened for bacteria.  Nasal cytology. A sample of mucus is taken from your nose and examined by your health care provider to determine if your sinusitis is related to an allergy. TREATMENT  Most cases of acute sinusitis are related to a viral infection and will resolve on their own within 10 days. Sometimes medicines are prescribed to help relieve symptoms (pain medicine, decongestants, nasal steroid sprays, or saline sprays).  However, for sinusitis related to a bacterial infection, your health care provider will prescribe antibiotic medicines. These are medicines that will help kill the bacteria causing the infection.  Rarely, sinusitis is caused by a fungal infection. In theses cases, your health care provider will prescribe antifungal medicine. For some cases of chronic sinusitis, surgery is needed. Generally, these are cases in which sinusitis recurs more than 3 times per year, despite other treatments. HOME CARE INSTRUCTIONS   Drink plenty of water. Water helps thin the mucus so your sinuses can drain more easily.  Use a humidifier.  Inhale steam 3 to 4 times a day (for example, sit in the bathroom with the shower running).  Apply a warm, moist washcloth to your face 3 to 4 times a day, or as directed by your health care provider.  Use saline nasal sprays to help moisten and clean your sinuses.  Take medicines only as directed by your health care provider.  If you were prescribed either an antibiotic or antifungal medicine, finish it all even if you start to feel better. SEEK IMMEDIATE  MEDICAL CARE IF:  You have increasing pain or severe headaches.  You have nausea, vomiting, or drowsiness.  You have swelling around your face.  You have vision problems.  You have a stiff neck.  You have difficulty breathing. MAKE SURE YOU:   Understand these instructions.  Will watch your condition.  Will get help right away if you are not doing well or get worse. Document Released: 05/12/2005 Document Revised: 09/26/2013 Document Reviewed: 05/27/2011 ExitCare Patient Information 2015 [employer]. This information is not intended to replace advice given to you by your health care provider. Make sure you discuss any questions you have with your health care provider.   

## 2015-07-09 ENCOUNTER — Telehealth: Payer: Self-pay | Admitting: Family

## 2015-07-09 NOTE — Telephone Encounter (Signed)
Patient states he recently seen Marya Amsler for a sinus infection.  Patient states he was told to call back if he wasn't doing any better.  Patient states he has actually gotten worse.  Patient is requesting a call back in regards.

## 2015-07-09 NOTE — Telephone Encounter (Signed)
Still having dizziness, nose bleeds, really bad nasal congestion, ringing in the ears. States that you suggested that he see ENT. Also is there anything else that can be sent in in the mean time? Please advise

## 2015-07-10 MED ORDER — LEVOFLOXACIN 500 MG PO TABS: 500.0000 mg | ORAL_TABLET | Freq: Every day | ORAL | Status: DC

## 2015-07-10 MED ORDER — PREDNISONE 20 MG PO TABS: 20.0000 mg | ORAL_TABLET | Freq: Two times a day (BID) | ORAL | Status: DC

## 2015-07-10 NOTE — Telephone Encounter (Signed)
Prednisone and levofloxacin sent to pharmacy.

## 2015-07-12 NOTE — Telephone Encounter (Signed)
Pt is aware.  

## 2016-10-06 ENCOUNTER — Encounter: Payer: Self-pay | Admitting: Gastroenterology

## 2016-11-10 ENCOUNTER — Ambulatory Visit (INDEPENDENT_AMBULATORY_CARE_PROVIDER_SITE_OTHER): Payer: BLUE CROSS/BLUE SHIELD | Admitting: Gastroenterology

## 2016-11-10 ENCOUNTER — Encounter: Payer: Self-pay | Admitting: Gastroenterology

## 2016-11-10 ENCOUNTER — Encounter (INDEPENDENT_AMBULATORY_CARE_PROVIDER_SITE_OTHER): Payer: Self-pay

## 2016-11-10 ENCOUNTER — Telehealth: Payer: Self-pay

## 2016-11-10 VITALS — BP 120/62 | HR 64 | Ht 64.0 in | Wt 156.0 lb

## 2016-11-10 DIAGNOSIS — Z1211 Encounter for screening for malignant neoplasm of colon: Secondary | ICD-10-CM | POA: Diagnosis not present

## 2016-11-10 MED ORDER — NA SULFATE-K SULFATE-MG SULF 17.5-3.13-1.6 GM/177ML PO SOLN: 1.0000 | Freq: Once | ORAL | 0 refills | Status: AC

## 2016-11-10 NOTE — Progress Notes (Signed)
HPI: This is a    whvery pleasant 61 year old man who was referred to me by Golden Circle, FNP  to evacolon cancer screening .    Chief complaint is routine risk for colon cancer screening  No Gi bleeding.  Feels his bowels are 'never normal.'  Can feel constipated at times.  Has tried some things but never fiber supplements.   Tried to stay hydrated.  Drinks a lot of coffee.  He's on plavix for CAD (CABG in 2007, stenting about a year later).  No FH of colon cancer  Old Data Reviewed:  Colonoscopy August 2007, Dr. Erskine Emery. Indications "Per [employer] Protocol: RK270623/762": Findings: a single subcentimeter colon polyp. This was hyperplastic on pathology.       Review of systems: Pertinent positive and negative review of systems were noted in the above HPI section. All other review negative.   Past Medical History:  Diagnosis Date  . Allergy   . Anxiety   . Colon polyps   . Coronary artery disease   . Depression   . GERD (gastroesophageal reflux disease)   . History of open heart surgery   . Hypercholesteremia   . Hypertension     Past Surgical History:  Procedure Laterality Date  . ANGIOPLASTY     stent placement  . CORONARY ARTERY BYPASS GRAFT     3x  . LEG SURGERY Left     Current Outpatient Prescriptions  Medication Sig Dispense Refill  . aspirin 81 MG tablet Take 81 mg by mouth daily.    Marland Kitchen atorvastatin (LIPITOR) 20 MG tablet Take 80 mg by mouth every evening.     . bisoprolol-hydrochlorothiazide (ZIAC) 2.5-6.25 MG per tablet Take 1 tablet by mouth daily.    . clopidogrel (PLAVIX) 75 MG tablet Take 75 mg by mouth daily.    . Multiple Vitamin (MULITIVITAMIN WITH MINERALS) TABS Take 1 tablet by mouth daily.    . pantoprazole (PROTONIX) 40 MG tablet Take 40 mg by mouth daily.    . sertraline (ZOLOFT) 50 MG tablet Take 25 mg by mouth at bedtime.    . nitroGLYCERIN (NITROSTAT) 0.4 MG SL tablet Place 0.4 mg under the tongue every 5 (five)  minutes as needed. For chest pain     No current facility-administered medications for this visit.     Allergies as of 11/10/2016  . (No Known Allergies)    Family History  Problem Relation Age of Onset  . Healthy Mother   . Skin cancer Father   . Healthy Maternal Grandmother   . Healthy Maternal Grandfather   . Healthy Paternal Grandmother   . Healthy Paternal Grandfather   . Heart disease Brother   . Heart disease Brother     Social History   Social History  . Marital status: Divorced    Spouse name: N/A  . Number of children: 2  . 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 Smoking status: Former Smoker    Quit date: 05/27/2004  . Smokeless tobacco: Never Used  . Alcohol use No  . Drug use: No  . Sexual activity: Yes   Other Topics Concern  . Not on file   Social History Narrative   Fun: Football and anything sports related.   Denies religious beliefs effecting health care.      Physical Exam: BP 120/62 (BP Location: Left Arm, Patient Position: Sitting, Cuff Size: Normal)  Pulse 64   Ht 5\' 4"  (1.626 m) Comment: height measured without shoes  Wt 156 lb (70.8 kg)   BMI 26.78 kg/m  Constitutional: generally well-appearing Psychiatric: alert and oriented x3 Eyes: extraocular movements intact Mouth: oral pharynx moist, no lesions Neck: supple no lymphadenopathy Cardiovascular: heart regular rate and rhythm Lungs: clear to auscultation bilaterally Abdomen: soft, nontender, nondistended, no obvious ascites, no peritoneal signs, normal bowel sounds Extremities: no lower extremity edema bilaterally Skin: no lesions on visible extremities   Assessment and plan: 61 y.o. male witroutine risk for colon cancer screening  he is on Plavix for coronary artery disease, remote cardiac stenting. He understands that this is a potent blood thinner puts him at increased risk for procedural related bleeding during  a colonoscopy and would prefer that he hold it for 5 days prior to colonoscopy. There is a small but real risk of holding the medicine such as stroke, thromboembolic events. We will communicate with his cardiologist for safety of holding that medicine for 5 days and we'll plan for colonoscopy several weeks from now for routine risk screening. I see no reason for any further blood tests or imaging studies prior to then.    Please see the "Patient Instructions" section for addition details about the plan.   Owens Loffler, MD [facility] 11/10/2016, 10:32 AM  Cc: Golden Circle, FNP

## 2016-11-10 NOTE — Patient Instructions (Addendum)
You will be set up for a colonoscopy for screening.  We will communicate with Dr. Nadyne Coombes about the safety of holding your plavix for 5 days prior to the colonoscopy to decrease the risk of procedure related serious bleeding.  Normal BMI (Body Mass Index- based on height and weight) is between 19 and 25. Your BMI today is Body mass index is 26.78 kg/m. Marland Kitchen Please consider follow up  regarding your BMI with your Primary Care Provider.

## 2016-11-10 NOTE — Telephone Encounter (Signed)
11/10/2016   RE: [name] DOB: 1955-07-16 MRN: [id number]   Dear Dr Einar Gip,    We have scheduled the above patient for an endoscopic procedure. Our records show that he is on anticoagulation therapy.   Please advise as to how long the patient may come off his therapy of Plavix  prior to the procedure.  Please fax back to Katina Degree  at [phone].   Sincerely,    Jeoffrey Massed RN

## 2016-11-11 NOTE — Telephone Encounter (Signed)
The pt was advised that he can hold plavix 5 days prior to the procedure and verbalized understanding

## 2016-11-11 NOTE — Telephone Encounter (Signed)
Left message on machine to call back  

## 2016-12-05 ENCOUNTER — Encounter: Payer: Self-pay | Admitting: Gastroenterology

## 2016-12-05 ENCOUNTER — Ambulatory Visit (AMBULATORY_SURGERY_CENTER): Payer: BLUE CROSS/BLUE SHIELD | Admitting: Gastroenterology

## 2016-12-05 VITALS — BP 102/56 | HR 57 | Temp 97.8°F | Resp 12 | Ht 64.0 in | Wt 156.0 lb

## 2016-12-05 DIAGNOSIS — Z1211 Encounter for screening for malignant neoplasm of colon: Secondary | ICD-10-CM

## 2016-12-05 DIAGNOSIS — D124 Benign neoplasm of descending colon: Secondary | ICD-10-CM | POA: Diagnosis not present

## 2016-12-05 DIAGNOSIS — K635 Polyp of colon: Secondary | ICD-10-CM

## 2016-12-05 DIAGNOSIS — Z1212 Encounter for screening for malignant neoplasm of rectum: Secondary | ICD-10-CM

## 2016-12-05 MED ORDER — SODIUM CHLORIDE 0.9 % IV SOLN: 500.0000 mL | INTRAVENOUS | Status: AC

## 2016-12-05 NOTE — Progress Notes (Signed)
Called to room to assist during endoscopic procedure.  Patient ID and intended procedure confirmed with present staff. Received instructions for my participation in the procedure from the performing physician.  

## 2016-12-05 NOTE — Progress Notes (Signed)
Report to PACU, RN, vss, BBS= Clear.  

## 2016-12-05 NOTE — Op Note (Addendum)
[place] Patient Name: [name] Procedure Date: 12/05/2016 3:16 PM MRN: [id number] Endoscopist: Milus Banister , MD Age: 61 Referring MD:  Date of Birth: 20-Oct-1955 Gender: Male Account #: [id number] Procedure:                Colonoscopy Indications:              Screening for colorectal malignant neoplasm Medicines:                Monitored Anesthesia Care Procedure:                Pre-Anesthesia Assessment:                           - Prior to the procedure, a History and Physical                            was performed, and patient medications and                            allergies were reviewed. The patient's tolerance of                            previous anesthesia was also reviewed. The risks                            and benefits of the procedure and the sedation                            options and risks were discussed with the patient.                            All questions were answered, and informed consent                            was obtained. Prior Anticoagulants: The patient has                            taken Plavix (clopidogrel), last dose was 5 days                            prior to procedure. ASA Grade Assessment: II - A                            patient with mild systemic disease. After reviewing                            the risks and benefits, the patient was deemed in                            satisfactory condition to undergo the procedure.                           After obtaining informed consent, the colonoscope  was passed under direct vision. Throughout the                            procedure, the patient's blood pressure, pulse, and                            oxygen saturations were monitored continuously. The                            Colonoscope was introduced through the anus and                            advanced to the the cecum, identified by                            appendiceal  orifice and ileocecal valve. The                            colonoscopy was performed without difficulty. The                            patient tolerated the procedure well. The quality                            of the bowel preparation was excellent. The                            ileocecal valve, appendiceal orifice, and rectum                            were photographed. Scope In: 3:26:22 PM Scope Out: 3:34:14 PM Scope Withdrawal Time: 0 hours 6 minutes 26 seconds  Total Procedure Duration: 0 hours 7 minutes 52 seconds  Findings:                 A 3 mm polyp was found in the descending colon. The                            polyp was sessile. The polyp was removed with a                            cold snare. Resection and retrieval were complete.                           The exam was otherwise without abnormality on                            direct and retroflexion views. Complications:            No immediate complications. Estimated blood loss:                            None. Estimated Blood Loss:     Estimated blood loss: none. Impression:               -  One 3 mm polyp in the descending colon, removed                            with a cold snare. Resected and retrieved.                           - The examination was otherwise normal on direct                            and retroflexion views. Recommendation:           - Patient has a contact number available for                            emergencies. The signs and symptoms of potential                            delayed complications were discussed with the                            patient. Return to normal activities tomorrow.                            Written discharge instructions were provided to the                            patient.                           - Resume previous diet.                           - Continue present medications.                           You will receive a letter within 2-3 weeks with  the                            pathology results and my final recommendations.                           If the polyp(s) is proven to be 'pre-cancerous' on                            pathology, you will need repeat colonoscopy in 5                            years. If the polyp(s) is NOT 'precancerous' on                            pathology then you should repeat colon cancer                            screening in 10 years with colonoscopy without need  for colon cancer screening by any method prior to                            then (including stool testing). Milus Banister, MD 12/05/2016 3:36:10 PM This report has been signed electronically.

## 2016-12-05 NOTE — Patient Instructions (Signed)
Impressions/recommendations:  Polyp (handout given)  May resume Plavis today.  YOU HAD AN ENDOSCOPIC PROCEDURE TODAY AT [facility]:   Refer to the procedure report that was given to you for any specific questions about what was found during the examination.  If the procedure report does not answer your questions, please call your gastroenterologist to clarify.  If you requested that your care partner not be given the details of your procedure findings, then the procedure report has been included in a sealed envelope for you to review at your convenience later.  YOU SHOULD EXPECT: Some feelings of bloating in the abdomen. Passage of more gas than usual.  Walking can help get rid of the air that was put into your GI tract during the procedure and reduce the bloating. If you had a lower endoscopy (such as a colonoscopy or flexible sigmoidoscopy) you may notice spotting of blood in your stool or on the toilet paper. If you underwent a bowel prep for your procedure, you may not have a normal bowel movement for a few days.  Please Note:  You might notice some irritation and congestion in your nose or some drainage.  This is from the oxygen used during your procedure.  There is no need for concern and it should clear up in a day or so.  SYMPTOMS TO REPORT IMMEDIATELY:   Following lower endoscopy (colonoscopy or flexible sigmoidoscopy):  Excessive amounts of blood in the stool  Significant tenderness or worsening of abdominal pains  Swelling of the abdomen that is new, acute  Fever of 100F or higher   For urgent or emergent issues, a gastroenterologist can be reached at any hour by calling [phone].   DIET:  We do recommend a small meal at first, but then you may proceed to your regular diet.  Drink plenty of fluids but you should avoid alcoholic beverages for 24 hours.  ACTIVITY:  You should plan to take it easy for the rest of today and you should NOT DRIVE or use heavy  machinery until tomorrow (because of the sedation medicines used during the test).    FOLLOW UP: Our staff will call the number listed on your records the next business day following your procedure to check on you and address any questions or concerns that you may have regarding the information given to you following your procedure. If we do not reach you, we will leave a message.  However, if you are feeling well and you are not experiencing any problems, there is no need to return our call.  We will assume that you have returned to your regular daily activities without incident.  If any biopsies were taken you will be contacted by phone or by letter within the next 1-3 weeks.  Please call us at [phone] if you have not heard about the biopsies in 3 weeks.    SIGNATURES/CONFIDENTIALITY: You and/or your care partner have signed paperwork which will be entered into your electronic medical record.  These signatures attest to the fact that that the information above on your After Visit Summary has been reviewed and is understood.  Full responsibility of the confidentiality of this discharge information lies with you and/or your care-partner.

## 2016-12-08 ENCOUNTER — Telehealth: Payer: Self-pay | Admitting: *Deleted

## 2016-12-08 ENCOUNTER — Telehealth: Payer: Self-pay

## 2016-12-08 NOTE — Telephone Encounter (Signed)
Attempted to reach pt. With follow up call following endoscopic procedure 12/05/2016.  LM on pt. Ans. Machine to call us if they have any questions or concerns.

## 2016-12-08 NOTE — Telephone Encounter (Signed)
  Follow up Call-  Call back number 12/05/2016  Post procedure Call Back phone  # [phone]  Permission to leave phone message Yes  Some recent data might be hidden     No answer, left message

## 2016-12-11 ENCOUNTER — Encounter: Payer: Self-pay | Admitting: Gastroenterology

## 2018-08-30 ENCOUNTER — Other Ambulatory Visit: Payer: Self-pay

## 2018-08-30 MED ORDER — ATORVASTATIN CALCIUM 80 MG PO TABS: 80.0000 mg | ORAL_TABLET | Freq: Every day | ORAL | 1 refills | Status: DC

## 2019-02-14 ENCOUNTER — Ambulatory Visit: Payer: 59 | Admitting: Cardiology

## 2019-02-14 ENCOUNTER — Other Ambulatory Visit: Payer: Self-pay

## 2019-02-14 ENCOUNTER — Encounter: Payer: Self-pay | Admitting: Cardiology

## 2019-02-14 VITALS — BP 132/80 | HR 69 | Temp 98.2°F | Ht 64.0 in | Wt 166.0 lb

## 2019-02-14 DIAGNOSIS — I25810 Atherosclerosis of coronary artery bypass graft(s) without angina pectoris: Secondary | ICD-10-CM

## 2019-02-14 DIAGNOSIS — I10 Essential (primary) hypertension: Secondary | ICD-10-CM

## 2019-02-14 DIAGNOSIS — I2510 Atherosclerotic heart disease of native coronary artery without angina pectoris: Secondary | ICD-10-CM

## 2019-02-14 DIAGNOSIS — E7800 Pure hypercholesterolemia, unspecified: Secondary | ICD-10-CM

## 2019-02-14 NOTE — Progress Notes (Signed)
Primary Physician/Referring:  Merrilee Seashore, MD  [name], male    DOB: September 15, 1955, 63 y.o.   MRN: [id number]  Chief Complaint  Patient presents with  . Coronary Artery Disease  . Dizziness   HPI:    [name]  is a 63 y.o.  Caucasian male with history of known coronary artery disease status post CABG in 2006. His last echocardiogram was in 2013 revealing mild LVH and normal LVEF, last stress test was on 12/11/2014 revealing excellent exercise tolerance without evidence of ischemia.   He has not had any chest pain, has not used any sublingual nitroglycerin. No shortness of breath, no PND or orthopnea. He denies any symptoms of TIA, claudication.  He was seen 6 months ago and was doing well, but made an appointment to see me due to occasional dizziness especially when he gets up quickly. No chest pain or syncope. Still taking DAPT with ASA and Plavix per preference.   Past Medical History:  Diagnosis Date  . Allergy   . Anxiety   . Colon polyps   . Coronary artery disease   . Depression   . GERD (gastroesophageal reflux disease)   . History of open heart surgery   . Hypercholesteremia   . Hypertension   . Myocardial infarction [facility]) 2008   Past Surgical History:  Procedure Laterality Date  . ANGIOPLASTY     stent placement  . CORONARY ARTERY BYPASS GRAFT     3x  . LEG SURGERY Left    Social History   Socioeconomic History  . Marital status: Divorced    Spouse name: Not on file  . Number of children: 2  . Years of education: 54  . Highest education level: Not on file  Occupational History  . Occupation: [employer]  . Financial resource strain: Not on file  . Food insecurity    Worry: Not on file    Inability: Not on file  . Transportation needs    Medical: Not on file    Non-medical: Not on file  Tobacco Use  . Smoking status: Former Smoker    Quit date: 05/27/2004    Years since quitting: 14.7  . Smokeless  tobacco: Never Used  Substance and Sexual Activity  . Alcohol use: No  . Drug use: No  . Sexual activity: Yes  Lifestyle  . Physical activity    Days per week: Not on file    Minutes per session: Not on file  . Stress: Not on file  Relationships  . Social Herbalist on phone: Not on file    Gets together: Not on file    Attends religious service: Not on file    Active member of club or organization: Not on file    Attends meetings of clubs or organizations: Not on file    Relationship status: Not on file  . Intimate partner violence    Fear of current or ex partner: Not on file    Emotionally abused: Not on file    Physically abused: Not on file    Forced sexual activity: Not on file  Other Topics Concern  . Not on file  Social History Narrative   Fun: Football and anything sports related.   Denies religious beliefs effecting health care.    ROS  Review of Systems  Constitution: Negative for chills, decreased appetite, malaise/fatigue and weight gain.  Cardiovascular: Negative for dyspnea on exertion, leg  swelling and syncope.  Endocrine: Negative for cold intolerance.  Hematologic/Lymphatic: Does not bruise/bleed easily.  Musculoskeletal: Negative for joint swelling.  Gastrointestinal: Negative for abdominal pain, anorexia, change in bowel habit, hematochezia and melena.  Neurological: Positive for dizziness (occasional). Negative for headaches and light-headedness.  Psychiatric/Behavioral: Negative for depression and substance abuse.  All other systems reviewed and are negative.  Objective   Vitals with BMI 02/14/2019 12/05/2016 12/05/2016  Height '5\' 4"'  - -  Weight 166 lbs - -  BMI 76.28 - -  Systolic 315 176 160  Diastolic 80 56 53  Pulse 69 57 60    Blood pressure 132/80, pulse 69, temperature 98.2 F (36.8 C), height '5\' 4"'  (1.626 m), weight 166 lb (75.3 kg), SpO2 97 %. Body mass index is 28.49 kg/m.   Physical Exam  Constitutional: He appears  well-developed and well-nourished. No distress.  HENT:  Head: Atraumatic.  Eyes: Conjunctivae are normal.  Neck: Neck supple. No JVD present. No thyromegaly present.  Cardiovascular: Normal rate, regular rhythm, normal heart sounds and intact distal pulses. Exam reveals no gallop.  No murmur heard. Pulmonary/Chest: Effort normal and breath sounds normal.  Abdominal: Soft. Bowel sounds are normal.  Musculoskeletal: Normal range of motion.  Neurological: He is alert.  Skin: Skin is warm and dry.  Psychiatric: He has a normal mood and affect.   Radiology: No results found.  Laboratory examination:   03/24/2018: Creatinine 0.91, EGFR 96/111, potassium 4.9, CMP normal.  CBC normal.  Cholesterol 124, triglycerides 87, HDL 44, LDL 63.  06/15/2017: Glucose 101, creatinine 0.76, EGFR 99/1:15, potassium 4.9, CMP normal.  Cholesterol 128, triglycerides 120, HDL 43, LDL 61.  No results for input(s): NA, K, CL, CO2, GLUCOSE, BUN, CREATININE, CALCIUM, GFRNONAA, GFRAA in the last 8760 hours. CMP Latest Ref Rng & Units 03/19/2013 08/16/2011 02/03/2007  Glucose 70 - 99 mg/dL 114(H) 109(H) 92  BUN 6 - 23 mg/dL '10 14 9  ' Creatinine 0.50 - 1.35 mg/dL 0.67 0.61 0.72  Sodium 135 - 145 mEq/L 137 140 139  Potassium 3.5 - 5.1 mEq/L 3.3(L) 3.9 3.9  Chloride 96 - 112 mEq/L 101 106 105  CO2 19 - 32 mEq/L '26 24 28  ' Calcium 8.4 - 10.5 mg/dL 9.1 9.8 9.2  Total Protein 6.0 - 8.3 g/dL - 7.2 7.2  Total Bilirubin 0.3 - 1.2 mg/dL - 0.7 1.9(H)  Alkaline Phos 39 - 117 U/L - 107 93  AST 0 - 37 U/L - 18 20  ALT 0 - 53 U/L - 33 25   CBC Latest Ref Rng & Units 03/19/2013 08/16/2011 02/03/2007  WBC 4.0 - 10.5 K/uL 7.6 11.3(H) 10.4  Hemoglobin 13.0 - 17.0 g/dL 16.1 15.8 14.7  Hematocrit 39.0 - 52.0 % 43.9 43.4 41.4  Platelets 150 - 400 K/uL 168 222 185   Lipid Panel     Component Value Date/Time   CHOL  02/04/2007 0901    130        ATP III CLASSIFICATION:  <200     mg/dL   Desirable  200-239  mg/dL   Borderline  High  >=240    mg/dL   High   TRIG 64 02/04/2007 0901   HDL 46 02/04/2007 0901   CHOLHDL 2.8 02/04/2007 0901   VLDL 13 02/04/2007 0901   LDLCALC  02/04/2007 0901    71        Total Cholesterol/HDL:CHD Risk Coronary Heart Disease Risk Table  Men   Women  1/2 Average Risk   3.4   3.3   HEMOGLOBIN A1C No results found for: HGBA1C, MPG TSH No results for input(s): TSH in the last 8760 hours. Medications  No Known Allergies   Prior to Admission medications   Medication Sig Start Date End Date Taking? Authorizing Provider  aspirin 81 MG tablet Take 81 mg by mouth daily.    [provider]  atorvastatin (LIPITOR) 80 MG tablet Take 1 tablet (80 mg total) by mouth daily. 08/30/18 11/28/18  Adrian Prows, MD  bisoprolol-hydrochlorothiazide (ZIAC) 2.5-6.25 MG per tablet Take 1 tablet by mouth daily.    [provider]  clopidogrel (PLAVIX) 75 MG tablet Take 75 mg by mouth daily.    [provider]  Multiple Vitamin (MULITIVITAMIN WITH MINERALS) TABS Take 1 tablet by mouth daily.    [provider]  nitroGLYCERIN (NITROSTAT) 0.4 MG SL tablet Place 0.4 mg under the tongue every 5 (five) minutes as needed. For chest pain    [provider]  pantoprazole (PROTONIX) 40 MG tablet Take 40 mg by mouth daily.    [provider]  sertraline (ZOLOFT) 50 MG tablet Take 25 mg by mouth at bedtime.    [provider]     Current Outpatient Medications  Medication Instructions  . atorvastatin (LIPITOR) 80 mg, Oral, Daily  . bisoprolol (ZEBETA) 5 mg, Oral, Daily after supper  . clopidogrel (PLAVIX) 75 mg, Daily  . Multiple Vitamin (MULITIVITAMIN WITH MINERALS) TABS 1 tablet, Daily  . nitroGLYCERIN (NITROSTAT) 0.4 mg, Every 5 min PRN  . pantoprazole (PROTONIX) 40 mg, Oral, Daily  . sertraline (ZOLOFT) 25 mg, Daily at bedtime  . Triamcinolone Acetonide (NASACORT ALLERGY 24HR NA) 1-2 sprays, Each Nare, Daily    Cardiac Studies:    S/P CABG 01/10/2005. LIMA to LAD SVG to PDA ,SVG to Ramus intermediate (high OM).  Coronary angio 2007  atretic LIMA , Mild disease native LAD, Occluded Mid RCA, SVG to RCA was not visualized.  90% stenosis of  SVG to OM, s/p 2.5x28 Cypher  stent.   Echo 03/18/12:  Left ventricular cavity is normal in size.   Mild concentric hypertrophy.   Normal diastolic filling.   Normal global wall motion.   Normal systolic global function.   Calculated EF 61%.  Exercise sestamibi stress test 12/11/2014: 1. The resting electrocardiogram demonstrated normal sinus rhythm, incomplete RBBB and no resting arrhythmias.  Stress EKG was equivocal for ischemia, there was more pronounced T wave inversion in V1 to V3 with 1 mm ST segment depression in lateral leads with peak exercise which was back to baseline at less than 1 minute into recovery. The patient performed treadmill exercise using a Bruce protocol, completing 10:30 minutes. The patient completed an estimated workload of 12.59 METS, 92% of the maximum predicted heart rate. The stress test was terminated because of fatigue.  Excellent exercise tolerance, no chest pain. 2. Myocardial perfusion imaging is normal. Overall left ventricular systolic function was normal without regional wall motion abnormalities. The left ventricular ejection fraction was 71%.  This is a low risk study. No significant change from 04/29/2013.  Assessment     ICD-10-CM   1. Coronary artery disease involving native coronary artery of native heart without angina pectoris  I25.10 EKG 12-Lead  2. Coronary artery disease involving coronary bypass graft of native heart without angina pectoris  I25.810   3. Essential hypertension  I10   4. Hypercholesteremia  E78.00   D/C  ASA, Continue Plavix (Patient pref)   EKG 02/14/2019: Normal sinus rhythm at rate of 68 bpm, left atrial enlargement, normal axis.  Right bundle branch block.  No evidence of ischemia, normal QT interval. No significant  change from  Prior EKG.    Recommendations:   Patient seen for evaluation of dizziness, episodes occurring only occasionally and is wondering whether he should reduce the dose of beta blocker.  No orthostatic symptoms at all, he has had dizziness only if he suddenly stands up quickly and happens only sporadically.  Advised him to change bisoprolol to be taken in the evening instead of in the morning.  He has been on dual antiplatelet therapy for many years, to reduce the risk of bleeding, advised him to change her to single agent, he prefers to be on Plavix.  Otherwise blood pressure is well controlled, lipids are also well controlled and being managed by the PCP.  I'll see him back in one year for follow-up of coronary artery disease, obviously if he continues to persistently have dizziness, he will contact me sooner. He does have chronic right bundle branch block that essentially from previous.  Adrian Prows, MD, [facility] 02/14/2019, 3:56 PM [facility] [place] Pager: [phone] Office: [phone] If no answer Cell [phone]

## 2019-03-11 ENCOUNTER — Other Ambulatory Visit: Payer: Self-pay | Admitting: Cardiology

## 2019-04-28 ENCOUNTER — Other Ambulatory Visit: Payer: Self-pay

## 2019-04-28 MED ORDER — NITROGLYCERIN 0.4 MG SL SUBL: 0.4000 mg | SUBLINGUAL_TABLET | SUBLINGUAL | 2 refills | Status: DC | PRN

## 2019-05-05 ENCOUNTER — Other Ambulatory Visit: Payer: Self-pay

## 2019-05-05 DIAGNOSIS — Z20822 Contact with and (suspected) exposure to covid-19: Secondary | ICD-10-CM

## 2019-05-07 LAB — NOVEL CORONAVIRUS, NAA: SARS-CoV-2, NAA: NOT DETECTED

## 2019-10-21 ENCOUNTER — Other Ambulatory Visit: Payer: Self-pay | Admitting: Cardiology

## 2020-01-05 ENCOUNTER — Ambulatory Visit: Payer: 59 | Admitting: Podiatry

## 2020-01-05 ENCOUNTER — Ambulatory Visit (INDEPENDENT_AMBULATORY_CARE_PROVIDER_SITE_OTHER): Payer: 59

## 2020-01-05 ENCOUNTER — Encounter: Payer: Self-pay | Admitting: Podiatry

## 2020-01-05 ENCOUNTER — Other Ambulatory Visit: Payer: Self-pay

## 2020-01-05 DIAGNOSIS — M722 Plantar fascial fibromatosis: Secondary | ICD-10-CM

## 2020-01-05 MED ORDER — METHYLPREDNISOLONE 4 MG PO TBPK: ORAL_TABLET | ORAL | 0 refills | Status: DC

## 2020-01-05 MED ORDER — CELECOXIB 100 MG PO CAPS: 100.0000 mg | ORAL_CAPSULE | Freq: Two times a day (BID) | ORAL | 3 refills | Status: DC

## 2020-01-05 NOTE — Progress Notes (Signed)
Subjective:  [name], male    DOB: 12-01-1955,  MRN: [id number] HPI Chief Complaint  Patient presents with  . Foot Pain    Plantar heel left - aching x couple years, AM pain, stands on concrete floors all day, Dr. Gershon Mussel injected, rx'd meloxicam-temp relief, PCP rx'd different strength of meloxicam, tried multiple OTC insoles/braces  . Toe Pain    5th toe left- lateral side, tender and red, keeps bandaid on to cushion  . Foot Pain    Lateral foot left - pain  . New Patient (Initial Visit)    64 y.o. male presents with the above complaint.   ROS: Denies fever chills nausea vomiting muscle aches pains calf pain back pain chest pain shortness of breath.  Past Medical History:  Diagnosis Date  . Allergy   . Anxiety   . Colon polyps   . Coronary artery disease   . Depression   . GERD (gastroesophageal reflux disease)   . History of open heart surgery   . Hypercholesteremia   . Hypertension   . Myocardial infarction [facility]) 2008   Past Surgical History:  Procedure Laterality Date  . ANGIOPLASTY     stent placement  . CORONARY ARTERY BYPASS GRAFT     3x  . LEG SURGERY Left     Current Outpatient Medications:  .  atorvastatin (LIPITOR) 80 MG tablet, TAKE 1 TABLET BY MOUTH EVERY DAY, Disp: 90 tablet, Rfl: 1 .  bisoprolol (ZEBETA) 5 MG tablet, Take 5 mg by mouth daily after supper., Disp: , Rfl:  .  celecoxib (CELEBREX) 100 MG capsule, Take 1 capsule (100 mg total) by mouth 2 (two) times daily., Disp: 60 capsule, Rfl: 3 .  clopidogrel (PLAVIX) 75 MG tablet, Take 75 mg by mouth daily., Disp: , Rfl:  .  meloxicam (MOBIC) 15 MG tablet, Take 15 mg by mouth daily., Disp: , Rfl:  .  methylPREDNISolone (MEDROL DOSEPAK) 4 MG TBPK tablet, 6 day dose pack - take as directed, Disp: 21 tablet, Rfl: 0 .  Multiple Vitamin (MULITIVITAMIN WITH MINERALS) TABS, Take 1 tablet by mouth daily., Disp: , Rfl:  .  nitroGLYCERIN (NITROSTAT) 0.4 MG SL tablet, Place 1 tablet (0.4 mg  total) under the tongue every 5 (five) minutes as needed. For chest pain, Disp: 4 tablet, Rfl: 2 .  pantoprazole (PROTONIX) 40 MG tablet, Take 40 mg by mouth daily., Disp: , Rfl:  .  sertraline (ZOLOFT) 50 MG tablet, Take 25 mg by mouth at bedtime., Disp: , Rfl:   No Known Allergies Review of Systems Objective:  There were no vitals filed for this visit.  General: Well developed, nourished, in no acute distress, alert and oriented x3   Dermatological: Skin is warm, dry and supple bilateral. Nails x 10 are well maintained; remaining integument appears unremarkable at this time. There are no open sores, no preulcerative lesions, no rash or signs of infection present.  Vascular: Dorsalis Pedis artery and Posterior Tibial artery pedal pulses are 2/4 bilateral with immedate capillary fill time. Pedal hair growth present. No varicosities and no lower extremity edema present bilateral.   Neruologic: Grossly intact via light touch bilateral. Vibratory intact via tuning fork bilateral. Protective threshold with Semmes Wienstein monofilament intact to all pedal sites bilateral. Patellar and Achilles deep tendon reflexes 2+ bilateral. No Babinski or clonus noted bilateral.   Musculoskeletal: No gross boney pedal deformities bilateral. No pain, crepitus, or limitation noted with foot and ankle range of motion bilateral. Muscular  strength 5/5 in all groups tested bilateral.  Pain on palpation medial calcaneal tubercle of the left foot.  Of the posterior tibial tendon.  And the lateral aspect of the foot at the fourth fifth TMT joint.  Gait: Unassisted, Nonantalgic.    Radiographs:  Radiographs taken today demonstrate an osseously mature individual with small plantar distally oriented calcaneal spur left heel.  Soft tissue increase in density plantar skin insertion site consistent with plantar fasciitis.  Assessment & Plan:   Assessment: Chronic proximal plantar fasciitis with lateral compensatory  syndrome and posterior tibial tendinitis left.  Plan: Discussed etiology pathology conservative versus surgical therapies at this point I injected his left heel 20 mg Kenalog 5 mg Marcaine point maximal tenderness.  Tolerated procedure well without complications.  Placed in a plantar fascial brace he already has a night splint.  Start him on a Medrol Dosepak he will also start Celebrex 100 mg twice a day for 1 week and then off for a week he understands that and is amenable to it I will follow-up with him in 1 month we did discuss appropriate shoe gear stretching exercise ice therapy and shoe gear modifications.  I did express to him that this very well could be a tear of the plantar fascia and that it would not resolve without surgical intervention.  He understands and is amenable to it if necessary.     [name] T. [place], [place]

## 2020-01-05 NOTE — Patient Instructions (Signed)

## 2020-02-02 ENCOUNTER — Ambulatory Visit: Payer: 59 | Admitting: Podiatry

## 2020-02-02 ENCOUNTER — Other Ambulatory Visit: Payer: Self-pay

## 2020-02-02 DIAGNOSIS — M722 Plantar fascial fibromatosis: Secondary | ICD-10-CM | POA: Diagnosis not present

## 2020-02-02 NOTE — Progress Notes (Signed)
He presents today for follow-up of his plantar fasciitis states that he is feeling about 80% improved.  He states that the only time it bothers him is when he is standing still.  He has has no swelling has been treating with the brace and Celebrex icing and stretching exercises.  He thinks he is doing very well.  Objective: Vital signs are stable he is alert oriented x3.  Pulses are palpable.  He has pain palpation medial calcaneal tubercle left foot.  Assessment: Plantar fasciitis left residual 80% improved.  Plan: I went ahead and reinjected his left heel today 20 mg Kenalog 5 mg Marcaine point maximal tenderness.  Like follow-up with him in 1 month.

## 2020-02-16 ENCOUNTER — Other Ambulatory Visit: Payer: Self-pay

## 2020-02-16 ENCOUNTER — Ambulatory Visit: Payer: 59 | Admitting: Cardiology

## 2020-02-16 ENCOUNTER — Encounter: Payer: Self-pay | Admitting: Cardiology

## 2020-02-16 VITALS — BP 124/60 | HR 69 | Resp 16 | Ht 64.0 in | Wt 160.0 lb

## 2020-02-16 DIAGNOSIS — I10 Essential (primary) hypertension: Secondary | ICD-10-CM

## 2020-02-16 DIAGNOSIS — I2510 Atherosclerotic heart disease of native coronary artery without angina pectoris: Secondary | ICD-10-CM

## 2020-02-16 DIAGNOSIS — E7800 Pure hypercholesterolemia, unspecified: Secondary | ICD-10-CM

## 2020-02-16 DIAGNOSIS — I25810 Atherosclerosis of coronary artery bypass graft(s) without angina pectoris: Secondary | ICD-10-CM

## 2020-02-16 MED ORDER — EZETIMIBE 10 MG PO TABS: 10.0000 mg | ORAL_TABLET | Freq: Every day | ORAL | 3 refills | Status: DC

## 2020-02-16 NOTE — Progress Notes (Signed)
Primary Physician/Referring:  Merrilee Seashore, MD  [name], male    DOB: 12-Jun-1955, 64 y.o.   MRN: [id number]  Chief Complaint  Patient presents with  . Coronary Artery Disease  . Hyperlipidemia   HPI:    [name]  is a 64 y.o.  Caucasian male with history of known coronary artery disease status post CABG in 2006. His last echocardiogram was in 2013 revealing mild LVH and normal LVEF, last stress test was on 12/11/2014 revealing excellent exercise tolerance without evidence of ischemia. His history includes hypertension and hyperlipidemia.  The patient presents for annual visit for coronary artery disease. He has had no chest pain, shortness of breath, PND, or orthopnea. He has not used any nitroglycerin. At his last visit he was experiencing some dizziness that resolved when he started taking his beta blocker at night rather than in the morning. He is currently being treated for plantar fasciitis and states this has been limiting his ability to exercise.  Past Medical History:  Diagnosis Date  . Allergy   . Anxiety   . Colon polyps   . Coronary artery disease   . Depression   . GERD (gastroesophageal reflux disease)   . History of open heart surgery   . Hypercholesteremia   . Hypertension   . Myocardial infarction [facility]) 2008   Past Surgical History:  Procedure Laterality Date  . ANGIOPLASTY     stent placement  . CORONARY ARTERY BYPASS GRAFT     3x  . LEG SURGERY Left    Social History   Tobacco Use  . Smoking status: Former Smoker    Quit date: 05/27/2004    Years since quitting: 15.7  . Smokeless tobacco: Never Used  Substance Use Topics  . Alcohol use: No   Marital Status: Divorced  ROS  Review of Systems  Constitutional: Negative for malaise/fatigue and weight gain.  Cardiovascular: Negative for chest pain, claudication, dyspnea on exertion, leg swelling, palpitations and syncope.  Hematologic/Lymphatic: Does not bruise/bleed  easily.  Neurological: Negative for light-headedness.  All other systems reviewed and are negative.  Objective   Vitals with BMI 02/16/2020 02/14/2019 12/05/2016  Height '5\' 4"'  '5\' 4"'  -  Weight 160 lbs 166 lbs -  BMI 03.21 22.48 -  Systolic 250 037 048  Diastolic 60 80 56  Pulse 69 69 57    Blood pressure 124/60, pulse 69, resp. rate 16, height '5\' 4"'  (1.626 m), weight 160 lb (72.6 kg), SpO2 95 %. Body mass index is 27.46 kg/m.   Physical Exam Constitutional:      General: He is not in acute distress.    Appearance: He is well-developed.  HENT:     Head: Atraumatic.  Neck:     Thyroid: No thyromegaly.     Vascular: No JVD.  Cardiovascular:     Rate and Rhythm: Normal rate and regular rhythm.     Pulses: Intact distal pulses.     Heart sounds: Normal heart sounds. No murmur heard.  No gallop.   Pulmonary:     Effort: Pulmonary effort is normal.     Breath sounds: Normal breath sounds.  Skin:    General: Skin is warm and dry.  Neurological:     Mental Status: He is alert.    Laboratory examination:   CMP Latest Ref Rng & Units 03/19/2013 08/16/2011 02/03/2007  Glucose 70 - 99 mg/dL 114(H) 109(H) 92  BUN 6 - 23 mg/dL 10 14 9  Creatinine 0.50 - 1.35 mg/dL 0.67 0.61 0.72  Sodium 135 - 145 mEq/L 137 140 139  Potassium 3.5 - 5.1 mEq/L 3.3(L) 3.9 3.9  Chloride 96 - 112 mEq/L 101 106 105  CO2 19 - 32 mEq/L '26 24 28  ' Calcium 8.4 - 10.5 mg/dL 9.1 9.8 9.2  Total Protein 6.0 - 8.3 g/dL - 7.2 7.2  Total Bilirubin 0.3 - 1.2 mg/dL - 0.7 1.9(H)  Alkaline Phos 39 - 117 U/L - 107 93  AST 0 - 37 U/L - 18 20  ALT 0 - 53 U/L - 33 25   CBC Latest Ref Rng & Units 03/19/2013 08/16/2011 02/03/2007  WBC 4.0 - 10.5 K/uL 7.6 11.3(H) 10.4  Hemoglobin 13.0 - 17.0 g/dL 16.1 15.8 14.7  Hematocrit 39 - 52 % 43.9 43.4 41.4  Platelets 150 - 400 K/uL 168 222 185   Lipid Panel No results for input(s): CHOL, TRIG, LDLCALC, VLDL, HDL, CHOLHDL, LDLDIRECT in the last 8760 hours.   HEMOGLOBIN A1C No  results found for: HGBA1C, MPG   TSH No results for input(s): TSH in the last 8760 hours.  External Labs:  12/06/2019:  HDL 40.000 LDL-C 83.000 Cholesterol, total 153.000 Triglycerides 173.000  A1C 5.700 % Glucose Random 95.000  BUN 8.000 Creatinine, Serum 0.830 TSH 0.850  PSA 0.500   03/24/2018: Creatinine 0.91, EGFR 96/111, potassium 4.9, CMP normal.  CBC normal.  Cholesterol 124, triglycerides 87, HDL 44, LDL 63.  06/15/2017: Glucose 101, creatinine 0.76, EGFR 99/1:15, potassium 4.9, CMP normal.  Cholesterol 128, triglycerides 120, HDL 43, LDL 61.  Medications  No Known Allergies   Current Outpatient Medications on File Prior to Visit  Medication Sig Dispense Refill  . atorvastatin (LIPITOR) 80 MG tablet TAKE 1 TABLET BY MOUTH EVERY DAY 90 tablet 1  . bisoprolol (ZEBETA) 5 MG tablet Take 5 mg by mouth daily after supper.    . celecoxib (CELEBREX) 100 MG capsule Take 1 capsule (100 mg total) by mouth 2 (two) times daily. 60 capsule 3  . clopidogrel (PLAVIX) 75 MG tablet Take 75 mg by mouth daily.    . Multiple Vitamin (MULITIVITAMIN WITH MINERALS) TABS Take 1 tablet by mouth daily.    . nitroGLYCERIN (NITROSTAT) 0.4 MG SL tablet Place 1 tablet (0.4 mg total) under the tongue every 5 (five) minutes as needed. For chest pain 4 tablet 2  . pantoprazole (PROTONIX) 40 MG tablet Take 40 mg by mouth daily.    . sertraline (ZOLOFT) 50 MG tablet Take 25 mg by mouth at bedtime.     No current facility-administered medications on file prior to visit.    Radiology:   No results found.  Cardiac Studies:   S/P CABG 01/10/2005. LIMA to LAD SVG to PDA ,SVG to Ramus intermediate (high OM).  Coronary angio 2007  atretic LIMA , Mild disease native LAD, Occluded Mid RCA, SVG to RCA was not visualized.  90% stenosis of  SVG to OM, s/p 2.5x28 Cypher  stent.   Echo 03/18/12:  Left ventricular cavity is normal in size.   Mild concentric hypertrophy.   Normal diastolic filling.   Normal global wall  motion.   Normal systolic global function.   Calculated EF 61%.  Exercise sestamibi stress test 12/11/2014: 1. The resting electrocardiogram demonstrated normal sinus rhythm, incomplete RBBB and no resting arrhythmias.  Stress EKG was equivocal for ischemia, there was more pronounced T wave inversion in V1 to V3 with 1 mm ST segment depression in lateral leads with peak  exercise which was back to baseline at less than 1 minute into recovery. The patient performed treadmill exercise using a Bruce protocol, completing 10:30 minutes. The patient completed an estimated workload of 12.59 METS, 92% of the maximum predicted heart rate. The stress test was terminated because of fatigue.  Excellent exercise tolerance, no chest pain. 2. Myocardial perfusion imaging is normal. Overall left ventricular systolic function was normal without regional wall motion abnormalities. The left ventricular ejection fraction was 71%.  This is a low risk study. No significant change from 04/29/2013.  EKG:   EKG 02/16/2020: Normal sinus rhythm at rate of 70 bpm, normal axis, right bundle branch block.  No evidence of ischemia.  No significant change from 02/14/2019.  EKG 02/14/2019: Normal sinus rhythm at rate of 68 bpm, left atrial enlargement, normal axis.  Right bundle branch block.  No evidence of ischemia, normal QT interval. No significant change from  Prior EKG.   Assessment     ICD-10-CM   1. Coronary artery disease involving native coronary artery of native heart without angina pectoris  I25.10 EKG 12-Lead  2. Coronary artery disease involving coronary bypass graft of native heart without angina pectoris  I25.810   3. Essential hypertension  I10   4. Hypercholesteremia  E78.00 ezetimibe (ZETIA) 10 MG tablet    Lipid Panel With LDL/HDL Ratio     Medications Discontinued During This Encounter  Medication Reason  . meloxicam (MOBIC) 15 MG tablet Patient Preference  . methylPREDNISolone (MEDROL DOSEPAK) 4 MG TBPK  tablet Patient Preference   Meds ordered this encounter  Medications  . ezetimibe (ZETIA) 10 MG tablet    Sig: Take 1 tablet (10 mg total) by mouth daily after supper.    Dispense:  90 tablet    Refill:  3    Recommendations:   [name] is a 63 y.o. Caucasian male with history of known coronary artery disease status post CABG in 2006. His last echocardiogram was in 2013 revealing mild LVH and normal LVEF, last stress test was on 12/11/2014 revealing excellent exercise tolerance without evidence of ischemia. He also has a history of hypertension and hyperlipidemia.  The patient presents for one year follow up for coronary artery disease. He has not had any chest pain or dyspnea. His EKG shows no evidence of ischemia, he has chronic right bundle branch block that is essentially unchanged from previous EKG. He is on Plavix.  His blood pressure is well controlled. He is no longer having any intermittent dizziness since changing his beta blocker to be taken in the evening. He does not need any changes to his medications. His external labs were reviewed. His LDL is not at goal and triglycerides are elevated. I will start him on Zetia 10 mg and recheck a lipid profile in 2-3 months. I will see him back in one year for follow up.   Blair Heys, PA Student 02/16/20 5:34 PM   Patient seen and examined in conjunction with Blair Heys, PA second year student at [employer].  Time spent is in direct patient face to face encounter not including the teaching and training involved.    Adrian Prows, MD, [facility] 02/16/2020, 5:34 PM Office: [phone]

## 2020-03-01 ENCOUNTER — Ambulatory Visit: Payer: 59 | Admitting: Podiatry

## 2020-03-01 ENCOUNTER — Encounter: Payer: Self-pay | Admitting: Podiatry

## 2020-03-01 ENCOUNTER — Other Ambulatory Visit: Payer: Self-pay

## 2020-03-01 DIAGNOSIS — M7752 Other enthesopathy of left foot: Secondary | ICD-10-CM

## 2020-03-01 DIAGNOSIS — M722 Plantar fascial fibromatosis: Secondary | ICD-10-CM

## 2020-03-01 NOTE — Progress Notes (Signed)
Presents today for follow-up of his plantar fasciitis states he is doing much better.  He states that he has pain on palpation of the fifth metatarsal left.  He states it bothers him with shoe gear.  Objective: Vital signs stable he is alert oriented x3 he has no pain on palpation medial care tubercle though he does have fluctuance and pain on palpation of the fifth metatarsal base left.  Assessment: Bursitis fifth metatarsal left base most likely compensatory to plantar fasciitis which has resolved 100%.  Plan: Injected the area today with dexamethasone local anesthetic will follow up with him on an as-needed basis.

## 2020-03-15 ENCOUNTER — Other Ambulatory Visit: Payer: Self-pay

## 2020-03-15 DIAGNOSIS — E7800 Pure hypercholesterolemia, unspecified: Secondary | ICD-10-CM

## 2020-03-29 LAB — LIPID PANEL WITH LDL/HDL RATIO
Cholesterol, Total: 124 mg/dL (ref 100–199)
HDL: 47 mg/dL (ref >39)
LDL Chol Calc (NIH): 59 mg/dL (ref 0–99)
LDL/HDL Ratio: 1.3 ratio (ref 0.0–3.6)
Triglycerides: 93 mg/dL (ref 0–149)
VLDL Cholesterol Cal: 18 mg/dL (ref 5–40)

## 2020-05-11 ENCOUNTER — Other Ambulatory Visit: Payer: Self-pay | Admitting: Cardiology

## 2020-07-25 ENCOUNTER — Other Ambulatory Visit: Payer: Self-pay | Admitting: Podiatry

## 2020-07-25 NOTE — Telephone Encounter (Signed)
Please advise 

## 2021-01-14 ENCOUNTER — Other Ambulatory Visit: Payer: Self-pay | Admitting: Cardiology

## 2021-02-09 ENCOUNTER — Other Ambulatory Visit: Payer: Self-pay | Admitting: Cardiology

## 2021-02-09 DIAGNOSIS — E7800 Pure hypercholesterolemia, unspecified: Secondary | ICD-10-CM

## 2021-02-28 ENCOUNTER — Other Ambulatory Visit: Payer: Self-pay

## 2021-02-28 ENCOUNTER — Ambulatory Visit: Payer: 59 | Admitting: Cardiology

## 2021-02-28 ENCOUNTER — Encounter: Payer: Self-pay | Admitting: Cardiology

## 2021-02-28 VITALS — BP 126/69 | HR 66 | Temp 97.8°F | Ht 64.0 in | Wt 167.4 lb

## 2021-02-28 DIAGNOSIS — E7800 Pure hypercholesterolemia, unspecified: Secondary | ICD-10-CM

## 2021-02-28 DIAGNOSIS — I10 Essential (primary) hypertension: Secondary | ICD-10-CM

## 2021-02-28 DIAGNOSIS — I2510 Atherosclerotic heart disease of native coronary artery without angina pectoris: Secondary | ICD-10-CM

## 2021-02-28 NOTE — Progress Notes (Signed)
Primary Physician/Referring:  Merrilee Seashore, MD  [name], male    DOB: Oct 09, 1955, 65 y.o.   MRN: [id number]  Chief Complaint  Patient presents with   Coronary Artery Disease   Follow-up   Neck Pain   HPI:    [name]  is a 65 y.o.  Caucasian male with history of known coronary artery disease status post CABG in 2006. His last echocardiogram was in 2013 revealing mild LVH and normal LVEF, last stress test was on 12/11/2014 revealing excellent exercise tolerance without evidence of ischemia. His history includes hypertension and hyperlipidemia.  Patient presents for annual visit follow-up of coronary artery disease.  He remains asymptomatic from a cardiovascular standpoint.  Denies chest pain, dyspnea, palpitations, syncope, near syncope.  Denies orthopnea, PND, leg swelling.  He has not needed sublingual nitroglycerin.  He has had no recurrence of dizziness since last office visit.  His primary concern is pain secondary to plantar fasciitis as well as intermittent neck pain suggestive of musculoskeletal etiology.  Patient admits to poor activity level, with no formal exercise routine.  He has gained 7 pounds since his last office visit.   Past Medical History:  Diagnosis Date   Allergy    Anxiety    Colon polyps    Coronary artery disease    Depression    GERD (gastroesophageal reflux disease)    History of open heart surgery    Hypercholesteremia    Hypertension    Myocardial infarction ([place]) 2008   Past Surgical History:  Procedure Laterality Date   ANGIOPLASTY     stent placement   CORONARY ARTERY BYPASS GRAFT     3x   LEG SURGERY Left    Family History  Problem Relation Age of Onset   Healthy Mother    Skin cancer Father    Healthy Maternal Grandmother    Healthy Maternal Grandfather    Healthy Paternal Grandmother    Healthy Paternal Grandfather    Heart disease Brother    Heart disease Brother    Colon cancer Neg Hx    Stomach  cancer Neg Hx    Esophageal cancer Neg Hx    Social History   Tobacco Use   Smoking status: Former    Types: Cigarettes    Quit date: 05/27/2004    Years since quitting: 16.7   Smokeless tobacco: Never  Substance Use Topics   Alcohol use: No   Marital Status: Divorced  ROS  Review of Systems  Constitutional: Positive for weight gain. Negative for malaise/fatigue.  Cardiovascular:  Negative for chest pain, claudication, leg swelling, near-syncope, orthopnea, palpitations, paroxysmal nocturnal dyspnea and syncope.  Respiratory:  Negative for shortness of breath.   Neurological:  Negative for dizziness.  Objective   Vitals with BMI 02/28/2021 02/16/2020 02/14/2019  Height 5' 4" 5' 4" 5' 4"  Weight 167 lbs 6 oz 160 lbs 166 lbs  BMI 28.72 61.95 09.32  Systolic 671 245 809  Diastolic 69 60 80  Pulse 66 69 69    Blood pressure 126/69, pulse 66, temperature 97.8 F (36.6 C), temperature source Temporal, height 5' 4" (1.626 m), weight 167 lb 6.4 oz (75.9 kg), SpO2 98 %. Body mass index is 28.73 kg/m.   Physical Exam Vitals reviewed.  HENT:     Head: Normocephalic and atraumatic.  Cardiovascular:     Rate and Rhythm: Normal rate and regular rhythm.     Pulses: Intact distal pulses.  Heart sounds: S1 normal and S2 normal. No murmur heard.   No gallop.  Pulmonary:     Effort: Pulmonary effort is normal. No respiratory distress.     Breath sounds: No wheezing, rhonchi or rales.  Musculoskeletal:     Right lower leg: No edema.     Left lower leg: No edema.  Skin:    Comments: Sternotomy scar well-healed.  Neurological:     Mental Status: He is alert.   Laboratory examination:   CMP Latest Ref Rng & Units 03/19/2013 08/16/2011 02/03/2007  Glucose 70 - 99 mg/dL 114(H) 109(H) 92  BUN 6 - 23 mg/dL _0 Creatinine 0.50 - 1.35 mg/dL 0.67 0.61 0.72  Sodium 135 - 145 mEq/L 137 140 139  Potassium 3.5 - 5.1 mEq/L 3.3(L) 3.9 3.9  Chloride 96 - 112 mEq/L 101 106 105  CO2 19 -  32 mEq/L _1 Calcium 8.4 - 10.5 mg/dL 9.1 9.8 9.2  Total Protein 6.0 - 8.3 g/dL - 7.2 7.2  Total Bilirubin 0.3 - 1.2 mg/dL - 0.7 1.9(H)  Alkaline Phos 39 - 117 U/L - 107 93  AST 0 - 37 U/L - 18 20  ALT 0 - 53 U/L - 33 25   CBC Latest Ref Rng & Units 03/19/2013 08/16/2011 02/03/2007  WBC 4.0 - 10.5 K/uL 7.6 11.3(H) 10.4  Hemoglobin 13.0 - 17.0 g/dL 16.1 15.8 14.7  Hematocrit 39.0 - 52.0 % 43.9 43.4 41.4  Platelets 150 - 400 K/uL 168 222 185   Lipid Panel Recent Labs    03/28/20 0804  CHOL 124  TRIG 93  LDLCALC 59  HDL 47     HEMOGLOBIN A1C No results found for: HGBA1C, MPG   TSH No results for input(s): TSH in the last 8760 hours.  External Labs:  12/27/2020: HDL 56, LDL 59, total cholesterol 141, triglycerides 94  12/06/2019:  HDL 40.000 LDL-C 83.000 Cholesterol, total 153.000 Triglycerides 173.000  A1C 5.700 % Glucose Random 95.000  BUN 8.000 Creatinine, Serum 0.830 TSH 0.850  PSA 0.500   03/24/2018: Creatinine 0.91, EGFR 96/111, potassium 4.9, CMP normal.  CBC normal.  Cholesterol 124, triglycerides 87, HDL 44, LDL 63.  06/15/2017: Glucose 101, creatinine 0.76, EGFR 99/1:15, potassium 4.9, CMP normal.  Cholesterol 128, triglycerides 120, HDL 43, LDL 61. Allergies  No Known Allergies   Medications Prior to Visit:   Outpatient Medications Prior to Visit  Medication Sig Dispense Refill   Ascorbic Acid (VITAMIN C) 100 MG tablet 1 tablet     atorvastatin (LIPITOR) 80 MG tablet TAKE 1 TABLET BY MOUTH EVERY DAY 90 tablet 1   bisoprolol (ZEBETA) 5 MG tablet Take 5 mg by mouth daily after supper.     celecoxib (CELEBREX) 100 MG capsule TAKE 1 CAPSULE BY MOUTH TWICE A DAY 60 capsule 3   clopidogrel (PLAVIX) 75 MG tablet Take 75 mg by mouth daily.     ezetimibe (ZETIA) 10 MG tablet TAKE 1 TABLET (10 MG TOTAL) BY MOUTH DAILY AFTER SUPPER. 90 tablet 3   Multiple Vitamin (MULITIVITAMIN WITH MINERALS) TABS Take 1 tablet by mouth daily.     nitroGLYCERIN (NITROSTAT) 0.4  MG SL tablet PLACE 1 TABLET UNDER THE TONGUE EVERY 5 MINUTES AS NEEDED. FOR CHEST PAIN 25 tablet 2   pantoprazole (PROTONIX) 40 MG tablet Take 40 mg by mouth daily.     sertraline (ZOLOFT) 50 MG tablet Take 25 mg by mouth at bedtime.     Zinc Sulfate (ZINC  15 PO) Take by mouth.     No facility-administered medications prior to visit.   Final Medications at End of Visit    Current Meds  Medication Sig   Ascorbic Acid (VITAMIN C) 100 MG tablet 1 tablet   atorvastatin (LIPITOR) 80 MG tablet TAKE 1 TABLET BY MOUTH EVERY DAY   bisoprolol (ZEBETA) 5 MG tablet Take 5 mg by mouth daily after supper.   celecoxib (CELEBREX) 100 MG capsule TAKE 1 CAPSULE BY MOUTH TWICE A DAY   clopidogrel (PLAVIX) 75 MG tablet Take 75 mg by mouth daily.   ezetimibe (ZETIA) 10 MG tablet TAKE 1 TABLET (10 MG TOTAL) BY MOUTH DAILY AFTER SUPPER.   losartan (COZAAR) 25 MG tablet Take 1 tablet (25 mg total) by mouth daily.   Multiple Vitamin (MULITIVITAMIN WITH MINERALS) TABS Take 1 tablet by mouth daily.   nitroGLYCERIN (NITROSTAT) 0.4 MG SL tablet PLACE 1 TABLET UNDER THE TONGUE EVERY 5 MINUTES AS NEEDED. FOR CHEST PAIN   pantoprazole (PROTONIX) 40 MG tablet Take 40 mg by mouth daily.   sertraline (ZOLOFT) 50 MG tablet Take 25 mg by mouth at bedtime.   Zinc Sulfate (ZINC 15 PO) Take by mouth.   Radiology:   No results found.  Cardiac Studies:   S/P CABG 01/10/2005. LIMA to LAD SVG to PDA ,SVG to Ramus intermediate (high OM).  Coronary angio 2007  atretic LIMA , Mild disease native LAD, Occluded Mid RCA, SVG to RCA was not visualized.  90% stenosis of  SVG to OM, s/p 2.5x28 Cypher  stent.   Echo 03/18/12:  Left ventricular cavity is normal in size.   Mild concentric hypertrophy.   Normal diastolic filling.   Normal global wall motion.   Normal systolic global function.   Calculated EF 61%.  Exercise sestamibi stress test 12/11/2014: 1. The resting electrocardiogram demonstrated normal sinus rhythm, incomplete RBBB  and no resting arrhythmias.  Stress EKG was equivocal for ischemia, there was more pronounced T wave inversion in V1 to V3 with 1 mm ST segment depression in lateral leads with peak exercise which was back to baseline at less than 1 minute into recovery. The patient performed treadmill exercise using a Bruce protocol, completing 10:30 minutes. The patient completed an estimated workload of 12.59 METS, 92% of the maximum predicted heart rate. The stress test was terminated because of fatigue.  Excellent exercise tolerance, no chest pain. 2. Myocardial perfusion imaging is normal. Overall left ventricular systolic function was normal without regional wall motion abnormalities. The left ventricular ejection fraction was 71%.  This is a low risk study. No significant change from 04/29/2013.  EKG  02/28/2021: Sinus rhythm at a rate of 67 bpm.  Left atrial enlargement.  Normal axis.  Right bundle branch block.  No evidence of ischemia or underlying injury pattern.  Compared to EKG 02/16/2020, no significant change.  EKG 02/14/2019: Normal sinus rhythm at rate of 68 bpm, left atrial enlargement, normal axis.  Right bundle branch block.  No evidence of ischemia, normal QT interval. No significant change from  Prior EKG.   Assessment     ICD-10-CM   1. Coronary artery disease involving native coronary artery of native heart without angina pectoris  I25.10 EKG 81-OFBP    Basic metabolic panel    2. Hypercholesteremia  E78.00     3. Essential hypertension  Z02 Basic metabolic panel       There are no discontinued medications.  Meds ordered this encounter  Medications  losartan (COZAAR) 25 MG tablet    Sig: Take 1 tablet (25 mg total) by mouth daily.    Dispense:  90 tablet    Refill:  3     Recommendations:   [name] is a 65 y.o. Caucasian male with history of known coronary artery disease status post CABG in 2006. His last echocardiogram was in 2013 revealing mild LVH and normal LVEF,  last stress test was on 12/11/2014 revealing excellent exercise tolerance without evidence of ischemia. He also has a history of hypertension and hyperlipidemia.  Patient presents for annual follow-up of CAD.  Physical exam remained stable and EKG unchanged compared to previous.  He remains asymptomatic. I personally reviewed external labs, lipids are now under excellent control with addition of Zetia at last office visit.  Patient is tolerating beta-blocker therapy, statin therapy, and Plavix.  Notably he is not on ACE inhibitor or ARB, for reasons that are unclear despite thorough record review. Will therefore start him on losartan 25 mg once daily with repeat BMP in 1 week.   Discussed with patient regarding the importance of diet and lifestyle modifications, particularly increased physical activity.  His blood pressure is well controlled.  Will not make changes to cardiovascular medications at this time.  He is otherwise stable from a cardiovascular standpoint, but encouraged to lose weight particularly given recent weight gain.  Follow-up in 1 year, sooner if needed, for CAD, hypertension, hyperlipidemia.   Alethia Berthold, PA-C 03/01/2021, 3:58 PM Office: [phone]

## 2021-03-01 MED ORDER — LOSARTAN POTASSIUM 25 MG PO TABS: 25.0000 mg | ORAL_TABLET | Freq: Every day | ORAL | 3 refills | Status: DC

## 2021-07-28 ENCOUNTER — Other Ambulatory Visit: Payer: Self-pay | Admitting: Cardiology

## 2021-08-01 ENCOUNTER — Encounter (HOSPITAL_BASED_OUTPATIENT_CLINIC_OR_DEPARTMENT_OTHER): Payer: Self-pay | Admitting: Emergency Medicine

## 2021-08-01 ENCOUNTER — Emergency Department (HOSPITAL_BASED_OUTPATIENT_CLINIC_OR_DEPARTMENT_OTHER)
Admission: EM | Admit: 2021-08-01 | Discharge: 2021-08-01 | Disposition: A | Discharge status: Home / Self Care | Payer: 59 | Attending: Emergency Medicine | Admitting: Emergency Medicine

## 2021-08-01 ENCOUNTER — Other Ambulatory Visit: Payer: Self-pay

## 2021-08-01 ENCOUNTER — Emergency Department (HOSPITAL_BASED_OUTPATIENT_CLINIC_OR_DEPARTMENT_OTHER): Payer: 59

## 2021-08-01 DIAGNOSIS — Z7901 Long term (current) use of anticoagulants: Secondary | ICD-10-CM | POA: Insufficient documentation

## 2021-08-01 DIAGNOSIS — S0990XA Unspecified injury of head, initial encounter: Secondary | ICD-10-CM | POA: Diagnosis not present

## 2021-08-01 DIAGNOSIS — Y9355 Activity, bike riding: Secondary | ICD-10-CM | POA: Diagnosis not present

## 2021-08-01 DIAGNOSIS — S299XXA Unspecified injury of thorax, initial encounter: Secondary | ICD-10-CM | POA: Diagnosis present

## 2021-08-01 DIAGNOSIS — Y92009 Unspecified place in unspecified non-institutional (private) residence as the place of occurrence of the external cause: Secondary | ICD-10-CM | POA: Insufficient documentation

## 2021-08-01 DIAGNOSIS — S2241XA Multiple fractures of ribs, right side, initial encounter for closed fracture: Secondary | ICD-10-CM | POA: Diagnosis not present

## 2021-08-01 MED ORDER — ACETAMINOPHEN 325 MG PO TABS
650.0000 mg | ORAL_TABLET | Freq: Four times a day (QID) | ORAL | 0 refills | Status: DC | PRN
Start: 2021-08-01 — End: 2023-04-29

## 2021-08-01 MED ORDER — OXYCODONE-ACETAMINOPHEN 5-325 MG PO TABS: 1.0000 | ORAL_TABLET | Freq: Three times a day (TID) | ORAL | 0 refills | Status: DC | PRN

## 2021-08-01 MED ORDER — HYDROMORPHONE HCL 1 MG/ML IJ SOLN: 2.0000 mg | Freq: Once | INTRAMUSCULAR | Status: DC

## 2021-08-01 MED ORDER — OXYCODONE HCL 5 MG PO TABS
5.0000 mg | ORAL_TABLET | Freq: Four times a day (QID) | ORAL | 0 refills | Status: DC | PRN
Start: 2021-08-01 — End: 2022-08-12

## 2021-08-01 MED ORDER — HYDROMORPHONE HCL 2 MG PO TABS: 2.0000 mg | ORAL_TABLET | Freq: Once | ORAL | Status: DC

## 2021-08-01 MED ORDER — OXYCODONE HCL 5 MG PO TABS
10.0000 mg | ORAL_TABLET | Freq: Once | ORAL | Status: AC
  Administered 2021-08-01: 20:00:00 10 mg via ORAL
  Filled 2021-08-01: qty 2

## 2021-08-01 MED ORDER — OXYCODONE-ACETAMINOPHEN 5-325 MG PO TABS
1.0000 | ORAL_TABLET | Freq: Once | ORAL | Status: AC
  Administered 2021-08-01: 19:00:00 1 via ORAL
  Filled 2021-08-01: qty 1

## 2021-08-01 MED ORDER — SENNOSIDES-DOCUSATE SODIUM 8.6-50 MG PO TABS: 1.0000 | ORAL_TABLET | Freq: Every day | ORAL | 0 refills | Status: AC

## 2021-08-01 NOTE — ED Notes (Signed)
Dr. Langston Masker. I have this patient on the phone. They stated you sent in a prescription for Oxycodone to [facility] on [address] and they do not have it. They are wanting you to call something else in  ? ? ?
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
## 2021-08-01 NOTE — ED Provider Notes (Signed)
[facility] contacted me reporting they did not have oxycodone available in house but they do have percocet.  I placed anew order for 15 tablet percoet ?  ?Wyvonnia Dusky, MD ?08/01/21 2131 ? ?

## 2021-08-01 NOTE — Discharge Instructions (Addendum)
Please use the incentive spirometer taking 10 slow breaths at a time, 10 times a day for the next 10 days ?

## 2021-08-01 NOTE — ED Notes (Signed)
RT educated pt on the proper use of IS. Pt set to 1281m, pt able to achieve 2250 mL without difficulty.  ?

## 2021-08-07 ENCOUNTER — Other Ambulatory Visit: Payer: Self-pay

## 2021-08-07 ENCOUNTER — Ambulatory Visit (INDEPENDENT_AMBULATORY_CARE_PROVIDER_SITE_OTHER): Payer: 59

## 2021-08-07 ENCOUNTER — Ambulatory Visit: Payer: 59 | Admitting: Orthopaedic Surgery

## 2021-08-07 DIAGNOSIS — M25511 Pain in right shoulder: Secondary | ICD-10-CM | POA: Diagnosis not present

## 2021-08-07 DIAGNOSIS — G8929 Other chronic pain: Secondary | ICD-10-CM

## 2021-08-07 MED ORDER — METHYLPREDNISOLONE ACETATE 40 MG/ML IJ SUSP
40.0000 mg | INTRAMUSCULAR | Status: AC | PRN
  Administered 2021-08-07: 40 mg via INTRA_ARTICULAR

## 2021-08-07 MED ORDER — BUPIVACAINE HCL 0.25 % IJ SOLN
2.0000 mL | INTRAMUSCULAR | Status: AC | PRN
  Administered 2021-08-07: 2 mL via INTRA_ARTICULAR

## 2021-08-07 MED ORDER — LIDOCAINE HCL 2 % IJ SOLN
2.0000 mL | INTRAMUSCULAR | Status: AC | PRN
  Administered 2021-08-07: 2 mL

## 2021-08-07 NOTE — Progress Notes (Signed)
? ?Office Visit Note ?  ?Patient: [name]           ?Date of Birth: 1956-02-11           ?MRN: [id number] ?Visit Date: 08/07/2021 ?             ?Requested by: Merrilee Seashore, MD ?[address],  [place] [id number] ?PCP: Merrilee Seashore, MD ? ? ?Assessment & Plan: ?Visit Diagnoses:  ?1. Chronic right shoulder pain   ? ? ?Plan: Impression is right shoulder rotator cuff tendinitis.  I discussed proceeding with subacromial cortisone injection today followed by an MRI if his symptoms do not improve over the next several weeks.  He will call and let us know.  Otherwise, follow-up with [place] as needed. ? ?Follow-Up Instructions: Return if symptoms worsen or fail to improve.  ? ?Orders:  ?Orders Placed This Encounter  ?Procedures  ? Large Joint Inj: R subacromial bursa  ? XR Shoulder Right  ? ?No orders of the defined types were placed in this encounter. ? ? ? ? Procedures: ?Large Joint Inj: R subacromial bursa on 08/07/2021 10:04 AM ?Indications: pain ?Details: 22 G needle ?Medications: 2 mL lidocaine 2 %; 2 mL bupivacaine 0.25 %; 40 mg methylPREDNISolone acetate 40 MG/ML ?Outcome: tolerated well, no immediate complications ?Patient was prepped and draped in the usual sterile fashion.  ? ? ? ? ?Clinical Data: ?No additional findings. ? ? ?Subjective: ?Chief Complaint  ?Patient presents with  ? Right Shoulder - Pain  ? ? ?HPI patient is a pleasant 66 year old gentleman who comes in today with right shoulder pain for the past month.  The pain he has is progressively worsened.  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  The pain he has is to the anterior aspect as well as on the top of his shoulder.  He has associated weakness.  Pain is worse at night when he is trying to sleep.  He does not take anything for the pain.  There is an occasional burning sensation to the deltoid.  He denies any  paresthesias otherwise.  No previous cortisone injection to the shoulder ? ?Review of Systems as detailed in HPI.  All others reviewed and are negative. ? ? ?Objective: ?Vital Signs: There were no vitals taken for this visit. ? ?Physical Exam well-developed well-nourished gentleman in no acute distress.  Alert and oriented x3. ? ?Ortho Exam right shoulder exam reveals near full active range of motion all planes but he does have pain with the extremes of forward flexion, internal rotation and external rotation.  Markedly positive empty can.  Negative drop arm.  Negative cross body adduction.  Minimal tenderness to the [facility] joint.  Negative speeds and O'Brien's.  Negative belly press.  He has a 4 out of 5 strength with resisted external rotation.  He is neurovascular intact distally. ? ?Specialty Comments:  ?No specialty comments available. ? ?Imaging: ?XR Shoulder Right ? ?Result Date: 08/07/2021 ?X-rays demonstrate moderate degenerative change of the [facility] joint.  Otherwise, no acute or structural abnormalities  ? ? ?PMFS History: ?Patient Active Problem List  ? Diagnosis Date Noted  ? Acute sinusitis 06/22/2015  ? Essential hypertension 01/15/2015  ? Depression 01/15/2015  ? GERD (gastroesophageal reflux disease) 01/15/2015  ? Gastric nodule 02/20/2012  ? ?Past Medical History:  ?Diagnosis Date  ? Allergy   ? Anxiety   ?  Colon polyps   ? Coronary artery disease   ? Depression   ? GERD (gastroesophageal reflux disease)   ? History of open heart surgery   ? Hypercholesteremia   ? Hypertension   ? Myocardial infarction [facility]) 2008  ?  ?Family History  ?Problem Relation Age of Onset  ? Healthy Mother   ? Skin cancer Father   ? Healthy Maternal Grandmother   ? Healthy Maternal Grandfather   ? Healthy Paternal Grandmother   ? Healthy Paternal Grandfather   ? Heart disease Brother   ? Heart disease Brother   ? Colon cancer Neg Hx   ? Stomach cancer Neg Hx   ? Esophageal cancer Neg Hx   ?  ?Past Surgical History:  ?Procedure  Laterality Date  ? ANGIOPLASTY    ? stent placement  ? CORONARY ARTERY BYPASS GRAFT    ? 3x  ? LEG SURGERY Left   ? ?Social History  ? ?Occupational History  ? Occupation: [employer]  ?Tobacco Use  ? Smoking status: Former  ?  Types: Cigarettes  ?  Quit date: 05/27/2004  ?  Years since quitting: 17.2  ? Smokeless tobacco: Never  ?Vaping Use  ? Vaping Use: Never used  ?Substance and Sexual Activity  ? Alcohol use: No  ? Drug use: No  ? Sexual activity: Yes  ? ? ? ? ? ? ?

## 2021-09-17 ENCOUNTER — Encounter (HOSPITAL_BASED_OUTPATIENT_CLINIC_OR_DEPARTMENT_OTHER): Payer: Self-pay

## 2021-09-17 ENCOUNTER — Emergency Department (HOSPITAL_BASED_OUTPATIENT_CLINIC_OR_DEPARTMENT_OTHER)
Admission: EM | Admit: 2021-09-17 | Discharge: 2021-09-17 | Disposition: A | Discharge status: Home / Self Care | Payer: 59 | Attending: Emergency Medicine | Admitting: Emergency Medicine

## 2021-09-17 ENCOUNTER — Other Ambulatory Visit (HOSPITAL_BASED_OUTPATIENT_CLINIC_OR_DEPARTMENT_OTHER): Payer: Self-pay

## 2021-09-17 ENCOUNTER — Other Ambulatory Visit: Payer: Self-pay

## 2021-09-17 ENCOUNTER — Emergency Department (HOSPITAL_BASED_OUTPATIENT_CLINIC_OR_DEPARTMENT_OTHER): Payer: 59

## 2021-09-17 DIAGNOSIS — I119 Hypertensive heart disease without heart failure: Secondary | ICD-10-CM | POA: Diagnosis not present

## 2021-09-17 DIAGNOSIS — I2510 Atherosclerotic heart disease of native coronary artery without angina pectoris: Secondary | ICD-10-CM | POA: Insufficient documentation

## 2021-09-17 DIAGNOSIS — M62838 Other muscle spasm: Secondary | ICD-10-CM | POA: Diagnosis not present

## 2021-09-17 DIAGNOSIS — M542 Cervicalgia: Secondary | ICD-10-CM | POA: Diagnosis present

## 2021-09-17 DIAGNOSIS — Z79899 Other long term (current) drug therapy: Secondary | ICD-10-CM | POA: Insufficient documentation

## 2021-09-17 DIAGNOSIS — Z7902 Long term (current) use of antithrombotics/antiplatelets: Secondary | ICD-10-CM | POA: Insufficient documentation

## 2021-09-17 MED ORDER — LIDOCAINE 5 % EX PTCH
1.0000 | MEDICATED_PATCH | CUTANEOUS | Status: DC
  Administered 2021-09-17: 1 via TRANSDERMAL
  Filled 2021-09-17: qty 1

## 2021-09-17 MED ORDER — ACETAMINOPHEN 500 MG PO TABS
1000.0000 mg | ORAL_TABLET | Freq: Once | ORAL | Status: AC
Start: 2021-09-17 — End: 2021-09-17
  Administered 2021-09-17: 1000 mg via ORAL
  Filled 2021-09-17: qty 2

## 2021-09-17 MED ORDER — METHYLPREDNISOLONE 4 MG PO TBPK
ORAL_TABLET | ORAL | 0 refills | Status: DC
  Filled 2021-09-17: qty 21, 6d supply, fill #0

## 2021-09-17 MED ORDER — CYCLOBENZAPRINE HCL 10 MG PO TABS
10.0000 mg | ORAL_TABLET | Freq: Two times a day (BID) | ORAL | 0 refills | Status: AC | PRN
  Filled 2021-09-17: qty 20, 10d supply, fill #0

## 2021-09-17 MED ORDER — DEXAMETHASONE SODIUM PHOSPHATE 10 MG/ML IJ SOLN
10.0000 mg | Freq: Once | INTRAMUSCULAR | Status: AC
  Administered 2021-09-17: 10 mg via INTRAMUSCULAR
  Filled 2021-09-17: qty 1

## 2021-09-17 NOTE — Discharge Instructions (Addendum)
Recommend 1000 mg of Tylenol every 6 hours as needed for pain.  Recommend follow-up with spine doctor.  Recommend lidocaine patches.  I have prescribed you a steroid Dosepak.  Take your next dose tomorrow as you have already been given a dose today.  I have also prescribed you a muscle relaxant called Flexeril.  Do not use while driving or doing other dangerous activities as this medication is sedating.  Follow-up with your primary care doctor as well. ? ?You may need thyroid ultrasound to further evaluate thyroid gland as discussed as well. ?
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
## 2021-09-27 ENCOUNTER — Other Ambulatory Visit: Payer: Self-pay | Admitting: Internal Medicine

## 2021-09-27 DIAGNOSIS — E049 Nontoxic goiter, unspecified: Secondary | ICD-10-CM

## 2021-10-15 ENCOUNTER — Encounter: Payer: Self-pay | Admitting: Gastroenterology

## 2021-10-31 ENCOUNTER — Other Ambulatory Visit (HOSPITAL_COMMUNITY): Payer: Self-pay | Admitting: Internal Medicine

## 2021-10-31 DIAGNOSIS — E049 Nontoxic goiter, unspecified: Secondary | ICD-10-CM

## 2021-11-08 ENCOUNTER — Other Ambulatory Visit (HOSPITAL_BASED_OUTPATIENT_CLINIC_OR_DEPARTMENT_OTHER): Payer: Self-pay | Admitting: Emergency Medicine

## 2021-11-11 ENCOUNTER — Encounter (HOSPITAL_BASED_OUTPATIENT_CLINIC_OR_DEPARTMENT_OTHER): Payer: Self-pay

## 2021-11-11 ENCOUNTER — Other Ambulatory Visit (HOSPITAL_BASED_OUTPATIENT_CLINIC_OR_DEPARTMENT_OTHER): Payer: Self-pay

## 2022-02-26 DIAGNOSIS — E782 Mixed hyperlipidemia: Secondary | ICD-10-CM | POA: Diagnosis not present

## 2022-02-26 DIAGNOSIS — Z23 Encounter for immunization: Secondary | ICD-10-CM | POA: Diagnosis not present

## 2022-02-26 DIAGNOSIS — Z0000 Encounter for general adult medical examination without abnormal findings: Secondary | ICD-10-CM | POA: Diagnosis not present

## 2022-02-26 DIAGNOSIS — R7303 Prediabetes: Secondary | ICD-10-CM | POA: Diagnosis not present

## 2022-02-26 DIAGNOSIS — I2510 Atherosclerotic heart disease of native coronary artery without angina pectoris: Secondary | ICD-10-CM | POA: Diagnosis not present

## 2022-02-28 ENCOUNTER — Ambulatory Visit: Payer: PPO | Admitting: Student

## 2022-02-28 ENCOUNTER — Ambulatory Visit: Payer: PPO

## 2022-02-28 VITALS — BP 112/78 | HR 70 | Temp 98.0°F | Resp 16 | Ht 65.0 in | Wt 172.0 lb

## 2022-02-28 DIAGNOSIS — E7800 Pure hypercholesterolemia, unspecified: Secondary | ICD-10-CM | POA: Diagnosis not present

## 2022-02-28 DIAGNOSIS — I2510 Atherosclerotic heart disease of native coronary artery without angina pectoris: Secondary | ICD-10-CM | POA: Diagnosis not present

## 2022-02-28 DIAGNOSIS — I10 Essential (primary) hypertension: Secondary | ICD-10-CM | POA: Diagnosis not present

## 2022-02-28 NOTE — Progress Notes (Signed)
Primary Physician/Referring:  Merrilee Seashore, MD  [name], male    DOB: 11-24-1955, 66 y.o.   MRN: [id number]  Chief Complaint  Patient presents with   Follow-up    CAD   HPI:    [name]  is a 66 y.o.  Caucasian male with history of known coronary artery disease status post CABG in 2006. His last echocardiogram was in 2013 revealing mild LVH and normal LVEF, last stress test was on 12/11/2014 revealing excellent exercise tolerance without evidence of ischemia. His history includes hypertension and hyperlipidemia.  Patient presents for annual visit follow-up of coronary artery disease.  He remains asymptomatic from a cardiovascular standpoint.  Denies chest pain, dyspnea, palpitations, syncope, near syncope.  Denies orthopnea, PND, leg swelling.  He has not needed sublingual nitroglycerin.   Patient admits to poor activity level, with no formal exercise routine.  He plans to join gym soon. He continues to work on improving diet.   Past Medical History:  Diagnosis Date   Allergy    Anxiety    Colon polyps    Coronary artery disease    Depression    GERD (gastroesophageal reflux disease)    History of open heart surgery    Hypercholesteremia    Hypertension    Myocardial infarction ([place]) 2008   Past Surgical History:  Procedure Laterality Date   ANGIOPLASTY     stent placement   CORONARY ARTERY BYPASS GRAFT     3x   LEG SURGERY Left    Family History  Problem Relation Age of Onset   Healthy Mother    Skin cancer Father    Healthy Maternal Grandmother    Healthy Maternal Grandfather    Healthy Paternal Grandmother    Healthy Paternal Grandfather    Heart disease Brother    Heart disease Brother    Colon cancer Neg Hx    Stomach cancer Neg Hx    Esophageal cancer Neg Hx    Social History   Tobacco Use   Smoking status: Former    Types: Cigarettes    Quit date: 05/27/2004    Years since quitting: 17.7   Smokeless tobacco: Never   Substance Use Topics   Alcohol use: No   Marital Status: Divorced  ROS  Review of Systems  Constitutional: Negative for malaise/fatigue.  Cardiovascular:  Negative for chest pain, claudication, leg swelling, near-syncope, orthopnea, palpitations, paroxysmal nocturnal dyspnea and syncope.  Respiratory:  Negative for shortness of breath.   Neurological:  Negative for dizziness.   Objective      02/28/2022    8:37 AM 09/17/2021   10:00 AM 09/17/2021    9:30 AM  Vitals with BMI  Height '5\' 5"'     Weight 172 lbs    BMI 82.99    Systolic 371 696 789  Diastolic 78 74 72  Pulse 70 66 72    Blood pressure 112/78, pulse 70, temperature 98 F (36.7 C), temperature source Temporal, resp. rate 16, height '5\' 5"'  (1.651 m), weight 172 lb (78 kg), SpO2 96 %. Body mass index is 28.62 kg/m.   Physical Exam Vitals reviewed.  HENT:     Head: Normocephalic and atraumatic.  Cardiovascular:     Rate and Rhythm: Normal rate and regular rhythm.     Pulses: Intact distal pulses.     Heart sounds: S1 normal and S2 normal. No murmur heard.    No gallop.  Pulmonary:     Effort: Pulmonary  effort is normal. No respiratory distress.     Breath sounds: No wheezing, rhonchi or rales.  Musculoskeletal:     Right lower leg: No edema.     Left lower leg: No edema.  Neurological:     Mental Status: He is alert.    Laboratory examination:      Latest Ref Rng & Units 03/19/2013    1:07 PM 08/16/2011    9:24 AM 02/03/2007    9:48 PM  CMP  Glucose 70 - 99 mg/dL 114  109  92   BUN 6 - 23 mg/dL '10  14  9   ' Creatinine 0.50 - 1.35 mg/dL 0.67  0.61  0.72   Sodium 135 - 145 mEq/L 137  140  139   Potassium 3.5 - 5.1 mEq/L 3.3  3.9  3.9   Chloride 96 - 112 mEq/L 101  106  105   CO2 19 - 32 mEq/L '26  24  28   ' Calcium 8.4 - 10.5 mg/dL 9.1  9.8  9.2   Total Protein 6.0 - 8.3 g/dL  7.2  7.2   Total Bilirubin 0.3 - 1.2 mg/dL  0.7  1.9   Alkaline Phos 39 - 117 U/L  107  93   AST 0 - 37 U/L  18  20   ALT 0 -  53 U/L  33  25       Latest Ref Rng & Units 03/19/2013    1:07 PM 08/16/2011    9:24 AM 02/03/2007    9:48 PM  CBC  WBC 4.0 - 10.5 K/uL 7.6  11.3  10.4   Hemoglobin 13.0 - 17.0 g/dL 16.1  15.8  14.7   Hematocrit 39.0 - 52.0 % 43.9  43.4  41.4   Platelets 150 - 400 K/uL 168  222  185    Lipid Panel No results for input(s): "CHOL", "TRIG", "LDLCALC", "VLDL", "HDL", "CHOLHDL", "LDLDIRECT" in the last 8760 hours.    HEMOGLOBIN A1C No results found for: "HGBA1C", "MPG"   TSH No results for input(s): "TSH" in the last 8760 hours.  External Labs:  02/24/2022: Glucose 95, BUN/Cr 10/0.80. EGFR 1098. Na/K 139/4.0. Rest of the CMP normal H/H 15.7/45.8. MCV 92.9. Platelets 189 HbA1C 5.9% Chol 134, TG 166, HDL 42, LDL 59 TSH 1.01  Allergies  No Known Allergies   Medications Prior to Visit:   Outpatient Medications Prior to Visit  Medication Sig Dispense Refill   acetaminophen (TYLENOL) 325 MG tablet Take 2 tablets (650 mg total) by mouth every 6 (six) hours as needed for up to 30 doses for moderate pain or mild pain. 30 tablet 0   Ascorbic Acid (VITAMIN C) 100 MG tablet 1 tablet     atorvastatin (LIPITOR) 80 MG tablet TAKE 1 TABLET BY MOUTH EVERY DAY 90 tablet 1   bisoprolol (ZEBETA) 5 MG tablet Take 5 mg by mouth daily after supper.     celecoxib (CELEBREX) 100 MG capsule TAKE 1 CAPSULE BY MOUTH TWICE A DAY 60 capsule 3   clopidogrel (PLAVIX) 75 MG tablet Take 75 mg by mouth daily.     cyclobenzaprine (FLEXERIL) 10 MG tablet Take 1 tablet (10 mg total) by mouth 2 (two) times daily as needed for muscle spasms. 20 tablet 0   Multiple Vitamin (MULITIVITAMIN WITH MINERALS) TABS Take 1 tablet by mouth daily.     nitroGLYCERIN (NITROSTAT) 0.4 MG SL tablet PLACE 1 TABLET UNDER THE TONGUE EVERY 5 MINUTES AS NEEDED. FOR CHEST PAIN  25 tablet 2   pantoprazole (PROTONIX) 40 MG tablet Take 40 mg by mouth daily.     sertraline (ZOLOFT) 50 MG tablet Take 25 mg by mouth at bedtime.     Zinc Sulfate  (ZINC 15 PO) Take by mouth.     ezetimibe (ZETIA) 10 MG tablet TAKE 1 TABLET (10 MG TOTAL) BY MOUTH DAILY AFTER SUPPER. 90 tablet 3   losartan (COZAAR) 25 MG tablet Take 1 tablet (25 mg total) by mouth daily. 90 tablet 3   oxyCODONE (ROXICODONE) 5 MG immediate release tablet Take 1 tablet (5 mg total) by mouth every 6 (six) hours as needed for up to 15 doses for severe pain. 15 tablet 0   oxyCODONE-acetaminophen (PERCOCET/ROXICET) 5-325 MG tablet Take 1 tablet by mouth every 8 (eight) hours as needed for up to 15 doses for severe pain. (Patient not taking: Reported on 02/28/2022) 15 tablet 0   methylPREDNISolone (MEDROL DOSEPAK) 4 MG TBPK tablet Follow package insert 21 each 0   No facility-administered medications prior to visit.   Final Medications at End of Visit    Current Meds  Medication Sig   acetaminophen (TYLENOL) 325 MG tablet Take 2 tablets (650 mg total) by mouth every 6 (six) hours as needed for up to 30 doses for moderate pain or mild pain.   Ascorbic Acid (VITAMIN C) 100 MG tablet 1 tablet   atorvastatin (LIPITOR) 80 MG tablet TAKE 1 TABLET BY MOUTH EVERY DAY   bisoprolol (ZEBETA) 5 MG tablet Take 5 mg by mouth daily after supper.   celecoxib (CELEBREX) 100 MG capsule TAKE 1 CAPSULE BY MOUTH TWICE A DAY   clopidogrel (PLAVIX) 75 MG tablet Take 75 mg by mouth daily.   cyclobenzaprine (FLEXERIL) 10 MG tablet Take 1 tablet (10 mg total) by mouth 2 (two) times daily as needed for muscle spasms.   Multiple Vitamin (MULITIVITAMIN WITH MINERALS) TABS Take 1 tablet by mouth daily.   nitroGLYCERIN (NITROSTAT) 0.4 MG SL tablet PLACE 1 TABLET UNDER THE TONGUE EVERY 5 MINUTES AS NEEDED. FOR CHEST PAIN   pantoprazole (PROTONIX) 40 MG tablet Take 40 mg by mouth daily.   sertraline (ZOLOFT) 50 MG tablet Take 25 mg by mouth at bedtime.   Zinc Sulfate (ZINC 15 PO) Take by mouth.   Radiology:   No results found.  Cardiac Studies:   S/P CABG 01/10/2005. LIMA to LAD SVG to PDA ,SVG to Ramus  intermediate (high OM).  Coronary angio 2007  atretic LIMA , Mild disease native LAD, Occluded Mid RCA, SVG to RCA was not visualized.  90% stenosis of  SVG to OM, s/p 2.5x28 Cypher  stent.   Echo 03/18/12:  Left ventricular cavity is normal in size.   Mild concentric hypertrophy.   Normal diastolic filling.   Normal global wall motion.   Normal systolic global function.   Calculated EF 61%.  Exercise sestamibi stress test 12/11/2014: 1. The resting electrocardiogram demonstrated normal sinus rhythm, incomplete RBBB and no resting arrhythmias.  Stress EKG was equivocal for ischemia, there was more pronounced T wave inversion in V1 to V3 with 1 mm ST segment depression in lateral leads with peak exercise which was back to baseline at less than 1 minute into recovery. The patient performed treadmill exercise using a Bruce protocol, completing 10:30 minutes. The patient completed an estimated workload of 12.59 METS, 92% of the maximum predicted heart rate. The stress test was terminated because of fatigue.  Excellent exercise tolerance, no chest pain. 2.  Myocardial perfusion imaging is normal. Overall left ventricular systolic function was normal without regional wall motion abnormalities. The left ventricular ejection fraction was 71%.  This is a low risk study. No significant change from 04/29/2013.  EKG  EKG 02/28/2022: Sinus rhythm at rate of 70 bpm.  Normal axis.  Left atrial enlargement.  Right bundle branch block.  No evidence of underlying ischemia or injury pattern.  Compared to EKG on 03/01/2019, no significant change  Assessment     ICD-10-CM   1. Coronary artery disease involving native coronary artery of native heart without angina pectoris  I25.10     2. Essential hypertension  I10 EKG 12-Lead    3. Hypercholesteremia  E78.00        Medications Discontinued During This Encounter  Medication Reason   methylPREDNISolone (MEDROL DOSEPAK) 4 MG TBPK tablet     No orders of the defined  types were placed in this encounter.  Recommendations:   Coronary artery disease involving native coronary artery of native heart without angina pectoris Physical exam remained stable and EKG unchanged compared to previous He has not had recurrence of chest pain and has not used SL nitro External labs reviewed, overall unremarkable  Essential hypertension Blood pressure was soft on initial check at today's visit, repeat manual check was 112/78 He does not check his blood pressure at home but does report that is was 130/70s last week at PCP Encouraged him to check blood pressure at least 1-2 times per week and keep a log, he will notify me of any low readings He has not had any dizziness or pre-syncope Continue losartan 79m daily  Hypercholesteremia Labs reviewed, last LDL 59 He is tolerating atorvastatin and zetia without myalgias Discussed with patient regarding the importance of diet and lifestyle modifications, particularly increased physical activity  Overall, he is doing well from cardiac standpoint  Follow-up in 1 year, sooner if needed, for CAD, hypertension, hyperlipidemia.   BErnst Spell AGNP-C 02/28/2022, 9:05 AM Office: [phone]

## 2022-03-18 ENCOUNTER — Other Ambulatory Visit: Payer: Self-pay | Admitting: Cardiology

## 2022-03-18 DIAGNOSIS — E7800 Pure hypercholesterolemia, unspecified: Secondary | ICD-10-CM

## 2022-03-22 ENCOUNTER — Other Ambulatory Visit: Payer: Self-pay | Admitting: Podiatry

## 2022-03-22 ENCOUNTER — Other Ambulatory Visit: Payer: Self-pay | Admitting: Cardiology

## 2022-08-12 ENCOUNTER — Encounter: Payer: Self-pay | Admitting: Podiatry

## 2022-08-12 ENCOUNTER — Ambulatory Visit (INDEPENDENT_AMBULATORY_CARE_PROVIDER_SITE_OTHER): Payer: PPO

## 2022-08-12 ENCOUNTER — Ambulatory Visit (INDEPENDENT_AMBULATORY_CARE_PROVIDER_SITE_OTHER): Payer: PPO | Admitting: Podiatry

## 2022-08-12 DIAGNOSIS — M778 Other enthesopathies, not elsewhere classified: Secondary | ICD-10-CM

## 2022-08-12 MED ORDER — TRIAMCINOLONE ACETONIDE 40 MG/ML IJ SUSP
20.0000 mg | Freq: Once | INTRAMUSCULAR | Status: AC
  Administered 2022-08-12: 20 mg

## 2022-08-12 NOTE — Progress Notes (Signed)
He presents today chief complaint of pain to the forefoot left.  States that has been aching now for the past few months he denies any injury to it states that it hurts across the ball of the foot as he points to the second third fourth metatarsophalangeal joint area and then the fifth metatarsal in total.  Objective: Vital signs stable he is alert and oriented x 3 no reproducible pain on palpation to the heel but he does have some tenderness on palpation to the third metatarsal head and end range of motion of the third metatarsal phalangeal joint.  Radiographs taken today demonstrate an osseously mature individual slightly elongated second and third metatarsophalangeal joints.  No significant osseous abnormalities are identified otherwise.  Assessment: Capsulitis third metatarsophalangeal joint left foot.  Plan: Injected the area today 10 mg Kenalog 5 mg Marcaine point of maximal tenderness like to follow-up with him in about 6 weeks.

## 2022-09-23 ENCOUNTER — Ambulatory Visit: Payer: PPO | Admitting: Podiatry

## 2022-09-24 DIAGNOSIS — I2510 Atherosclerotic heart disease of native coronary artery without angina pectoris: Secondary | ICD-10-CM | POA: Diagnosis not present

## 2022-09-24 DIAGNOSIS — R7303 Prediabetes: Secondary | ICD-10-CM | POA: Diagnosis not present

## 2022-09-24 DIAGNOSIS — E782 Mixed hyperlipidemia: Secondary | ICD-10-CM | POA: Diagnosis not present

## 2022-10-01 ENCOUNTER — Telehealth: Payer: Self-pay

## 2022-10-01 DIAGNOSIS — I2081 Angina pectoris with coronary microvascular dysfunction: Secondary | ICD-10-CM | POA: Diagnosis not present

## 2022-10-01 DIAGNOSIS — E782 Mixed hyperlipidemia: Secondary | ICD-10-CM | POA: Diagnosis not present

## 2022-10-01 DIAGNOSIS — I2510 Atherosclerotic heart disease of native coronary artery without angina pectoris: Secondary | ICD-10-CM | POA: Diagnosis not present

## 2022-10-01 DIAGNOSIS — H8110 Benign paroxysmal vertigo, unspecified ear: Secondary | ICD-10-CM | POA: Diagnosis not present

## 2022-10-01 DIAGNOSIS — R7303 Prediabetes: Secondary | ICD-10-CM | POA: Diagnosis not present

## 2022-10-01 NOTE — Patient Outreach (Signed)
  Care Coordination   In Person Provider Office Visit Note   10/01/2022 Name: [name] MRN: [id number] DOB: [birth year]  [name] is a 67 y.o. year old male who sees Georgianne Fick, MD for primary care. I engaged with [name] in the providers office today.  What matters to the patients health and wellness today?  none    Goals Addressed             This Visit's Progress    COMPLETED: Care Coordination Activities-No follow up required       Care Coordination Interventions: Discussed [facility] services and support. Assessed SDOH. Advised to discuss with primary care physician if services needed in the future.         SDOH assessments and interventions completed:  Yes  SDOH Interventions Today    Flowsheet Row Most Recent Value  SDOH Interventions   Housing Interventions Intervention Not Indicated  Transportation Interventions Intervention Not Indicated        Care Coordination Interventions:  Yes, provided   Follow up plan: No further intervention required.   Encounter Outcome:  Pt. Visit Completed   Bary Leriche, RN, MSN [facility] Care Management Care Management Coordinator Direct Line [phone]

## 2022-10-01 NOTE — Patient Instructions (Signed)
Visit Information  Thank you for taking time to visit with me today. Please don't hesitate to contact me if I can be of assistance to you.   Following are the goals we discussed today:   Goals Addressed             This Visit's Progress    COMPLETED: Care Coordination Activities-No follow up required       Care Coordination Interventions: Discussed [facility] services and support. Assessed SDOH. Advised to discuss with primary care physician if services needed in the future.         If you are experiencing a Mental Health or Behavioral Health Crisis or need someone to talk to, please call the Suicide and Crisis Lifeline: 988   The patient verbalized understanding of instructions, educational materials, and care plan provided today and DECLINED offer to receive copy of patient instructions, educational materials, and care plan.   The patient has been provided with contact information for the care management team and has been advised to call with any health related questions or concerns.   Micha Erck J Nabila Albarracin, RN, MSN [facility] Care Management Care Management Coordinator Direct Line [phone]     

## 2022-10-18 ENCOUNTER — Other Ambulatory Visit: Payer: Self-pay | Admitting: Cardiology
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
## 2023-02-23 DIAGNOSIS — H8110 Benign paroxysmal vertigo, unspecified ear: Secondary | ICD-10-CM | POA: Diagnosis not present

## 2023-03-03 NOTE — Progress Notes (Unsigned)
Cardiology Office Note:  .   Date:  03/04/2023  ID:  [name], DOB [birth year], MRN [id number] PCP: Georgianne Fick, MD  [facility] Providers Cardiologist:  Yates Decamp, MD    History of Present Illness: .   [name] is a 67 y.o.  Caucasian male with history of known coronary artery disease status post CABG in 2006. His last echocardiogram was in 2013 revealing mild LVH and normal LVEF, last stress test was on 12/11/2014 revealing excellent exercise tolerance without evidence of ischemia. His history includes hypertension and hyperlipidemia.   Patient presents for annual visit follow-up of coronary artery disease.  He remains asymptomatic from a cardiovascular standpoint.  Denies chest pain, dyspnea, palpitations, syncope, near syncope.  Denies orthopnea, PND, leg swelling.  He has not needed sublingual nitroglycerin.   Discussed the use of AI scribe software for clinical note transcription with the patient, who gave verbal consent to proceed.  History of Present Illness   The patient, with a history of coronary artery disease and coronary stents, presents for a routine follow-up. He reports no specific concerns, but admits to a decrease in physical activity and poor dietary habits. He walks his dog daily, but has not engaged in strenuous physical activity or regular gym visits as he used to. He denies any symptoms of shortness of breath, fatigue, or chest discomfort during his daily walks. He expresses a desire to return to work, but feels obligated to care for his dog at home.      Review of Systems  Cardiovascular:  Negative for chest pain, dyspnea on exertion and leg swelling.    Risk Assessment/Calculations:    External Labs:  Labs 10/01/2022:  Sodium 140, potassium 4.2, BUN 9, creatinine 0.80, EGFR 98 mL, LFTs normal.  A1c 5.8%.  Total cholesterol 124, triglycerides 116, HDL 49, LDL 52.  Physical Exam:   VS:  BP 108/64 (BP Location: Left Arm, Patient  Position: Sitting, Cuff Size: Normal)   Pulse 60   Resp 16   Ht 5\' 5"  (1.651 m)   Wt 168 lb (76.2 kg)   SpO2 96%   BMI 27.96 kg/m    Wt Readings from Last 3 Encounters:  03/04/23 168 lb (76.2 kg)  02/28/22 172 lb (78 kg)  09/17/21 170 lb (77.1 kg)     Physical Exam Neck:     Vascular: No carotid bruit or JVD.  Cardiovascular:     Rate and Rhythm: Normal rate and regular rhythm.     Pulses: Intact distal pulses.     Heart sounds: Normal heart sounds. No murmur heard.    No gallop.  Pulmonary:     Effort: Pulmonary effort is normal.     Breath sounds: Normal breath sounds.  Abdominal:     General: Bowel sounds are normal.     Palpations: Abdomen is soft.  Musculoskeletal:     Right lower leg: No edema.     Left lower leg: No edema.     Studies Reviewed: Marland Kitchen    EKG:    EKG Interpretation Date/Time:  Wednesday March 04 2023 09:15:29 EDT Ventricular Rate:  71 PR Interval:  184 QRS Duration:  164 QT Interval:  448 QTC Calculation: 486 R Axis:   76  Text Interpretation: EKG 03/04/2023: Normal sinus rhythm at the rate of 71 bpm, normal axis.  Right bundle branch block.  No significant change from 03/19/2013. Confirmed by Delrae Rend [phone]) on 03/04/2023 9:19:49 AM    EKG  02/28/2022: Sinus rhythm at rate of 70 bpm.  Normal axis.  Left atrial enlargement.  Right bundle branch block.  No evidence of underlying ischemia or injury pattern.  Compared to EKG on 03/01/2019, no significant change  S/P CABG 01/10/2005. LIMA to LAD (atretic by cath 2007), SVG to PDA ,SVG to Ramus intermediate (high OM).  Coronary angio 2007  atretic LIMA , Mild disease native LAD, Occluded Mid RCA, SVG to RCA was not visualized.  90% stenosis of  SVG to OM, s/p 2.5x28 Cypher  stent.   ASSESSMENT AND PLAN: .      ICD-10-CM   1. Coronary artery disease involving native coronary artery of native heart without angina pectoris  I25.10 EKG 12-Lead    ECHOCARDIOGRAM COMPLETE    MYOCARDIAL PERFUSION  IMAGING    Cardiac Stress Test: Informed Consent Details: Physician/Practitioner Attestation; Transcribe to consent form and obtain patient signature    Cardiac Stress Test: Informed Consent Details: Physician/Practitioner Attestation; Transcribe to consent form and obtain patient signature    2. Hx of CABG  Z95.1 ECHOCARDIOGRAM COMPLETE    MYOCARDIAL PERFUSION IMAGING    Cardiac Stress Test: Informed Consent Details: Physician/Practitioner Attestation; Transcribe to consent form and obtain patient signature    Cardiac Stress Test: Informed Consent Details: Physician/Practitioner Attestation; Transcribe to consent form and obtain patient signature    3. Essential hypertension  I10     4. Hypercholesteremia  E78.00       Assessment and Plan    Coronary Artery Disease Stable with no new symptoms. Patient has a history of coronary stents post CABG and atretic LIMA to LAD and is currently on Plavix, and Bisoprolol for CAD and blood pressure control.  Also on Lipitor 80mg  and Zetia for cholesterol management. Labs from family doctor reviewed and are satisfactory. However, patient has not had cardiac testing in over a decade and has reported a decrease in physical activity. -Order stress test and echocardiogram to assess current cardiac status. -Continue current medications:Plavix, Bisoprolol, Lipitor 80mg , and Zetia. -Encourage patient to increase physical activity. -Return for follow-up in 1 year or sooner if stress test reveals any abnormalities.  Hypertension Controlled with Bisoprolol. -Continue current medications: Bisoprolol.  Patient was previously on losartan which she has discontinued and we confirmed this with pharmacy as well.  As his blood pressure is very soft/108/64 mmHg, as patient is currently can remain stable, we will continue present medical management unless nuclear stress abnormal then will let us and may want to consider addition of an ACE inhibitor or an  ARB.  Hyperlipidemia Well controlled with Lipitor 80mg  and Zetia. -Continue current medications:Lipitor 80mg  and Zetia.    Informed Consent   Shared Decision Making/Informed Consent The risks [chest pain, shortness of breath, cardiac arrhythmias, dizziness, blood pressure fluctuations, myocardial infarction, stroke/transient ischemic attack, nausea, vomiting, allergic reaction, radiation exposure, metallic taste sensation and life-threatening complications (estimated to be 1 in 10,000)], benefits (risk stratification, diagnosing coronary artery disease, treatment guidance) and alternatives of a nuclear stress test were discussed in detail with Mr. Phong and he agrees to proceed.       Signed,  Yates Decamp, MD, [facility] 03/04/2023, 5:27 PM

## 2023-03-04 ENCOUNTER — Encounter: Payer: Self-pay | Admitting: *Deleted

## 2023-03-04 ENCOUNTER — Ambulatory Visit: Payer: PPO | Attending: Cardiology | Admitting: Cardiology

## 2023-03-04 ENCOUNTER — Encounter: Payer: Self-pay | Admitting: Cardiology

## 2023-03-04 VITALS — BP 108/64 | HR 60 | Resp 16 | Ht 65.0 in | Wt 168.0 lb

## 2023-03-04 DIAGNOSIS — I10 Essential (primary) hypertension: Secondary | ICD-10-CM

## 2023-03-04 DIAGNOSIS — E7800 Pure hypercholesterolemia, unspecified: Secondary | ICD-10-CM | POA: Diagnosis not present

## 2023-03-04 DIAGNOSIS — I2510 Atherosclerotic heart disease of native coronary artery without angina pectoris: Secondary | ICD-10-CM

## 2023-03-04 DIAGNOSIS — Z951 Presence of aortocoronary bypass graft: Secondary | ICD-10-CM

## 2023-03-04 NOTE — Patient Instructions (Signed)
Medication Instructions:  Your physician recommends that you continue on your current medications as directed. Please refer to the Current Medication list given to you today.  *If you need a refill on your cardiac medications before your next appointment, please call your pharmacy*   Lab Work: none If you have labs (blood work) drawn today and your tests are completely normal, you will receive your results only by: MyChart Message (if you have MyChart) OR A paper copy in the mail If you have any lab test that is abnormal or we need to change your treatment, we will call you to review the results.   Testing/Procedures: Your physician has requested that you have en exercise stress myoview. For further information please visit [email or web address] Please follow instruction sheet, as given.   Your physician has requested that you have an echocardiogram. Echocardiography is a painless test that uses sound waves to create images of your heart. It provides your doctor with information about the size and shape of your heart and how well your heart's chambers and valves are working. This procedure takes approximately one hour. There are no restrictions for this procedure. Please do NOT wear cologne, perfume, aftershave, or lotions (deodorant is allowed). Please arrive 15 minutes prior to your appointment time.    Follow-Up: At [facility], you and your health needs are our priority.  As part of our continuing mission to provide you with exceptional heart care, we have created designated Provider Care Teams.  These Care Teams include your primary Cardiologist (physician) and Advanced Practice Providers (APPs -  Physician Assistants and Nurse Practitioners) who all work together to provide you with the care you need, when you need it.  We recommend signing up for the patient portal called "MyChart".  Sign up information is provided on this After Visit Summary.  MyChart is used to connect  with patients for Virtual Visits (Telemedicine).  Patients are able to view lab/test results, encounter notes, upcoming appointments, etc.  Non-urgent messages can be sent to your provider as well.   To learn more about what you can do with MyChart, go to [email or web address].    Your next appointment:   12 month(s)  Provider:   Yates Decamp, MD     Other Instructions

## 2023-03-05 ENCOUNTER — Telehealth (HOSPITAL_COMMUNITY): Payer: Self-pay

## 2023-03-05 NOTE — Telephone Encounter (Signed)
Detailed instructions left on the patient's answering machine. Asked to call back with any questions. S.Aby Gessel CCT

## 2023-03-10 ENCOUNTER — Encounter (HOSPITAL_COMMUNITY): Payer: PPO

## 2023-03-12 DIAGNOSIS — Z0000 Encounter for general adult medical examination without abnormal findings: Secondary | ICD-10-CM | POA: Diagnosis not present

## 2023-03-12 DIAGNOSIS — R7303 Prediabetes: Secondary | ICD-10-CM | POA: Diagnosis not present

## 2023-03-12 DIAGNOSIS — I2510 Atherosclerotic heart disease of native coronary artery without angina pectoris: Secondary | ICD-10-CM | POA: Diagnosis not present

## 2023-03-12 DIAGNOSIS — Z125 Encounter for screening for malignant neoplasm of prostate: Secondary | ICD-10-CM | POA: Diagnosis not present

## 2023-03-12 DIAGNOSIS — E782 Mixed hyperlipidemia: Secondary | ICD-10-CM | POA: Diagnosis not present

## 2023-03-12 DIAGNOSIS — E049 Nontoxic goiter, unspecified: Secondary | ICD-10-CM | POA: Diagnosis not present

## 2023-03-17 ENCOUNTER — Ambulatory Visit (HOSPITAL_COMMUNITY): Payer: PPO | Attending: Cardiology

## 2023-03-17 DIAGNOSIS — Z951 Presence of aortocoronary bypass graft: Secondary | ICD-10-CM | POA: Insufficient documentation

## 2023-03-17 DIAGNOSIS — I2510 Atherosclerotic heart disease of native coronary artery without angina pectoris: Secondary | ICD-10-CM | POA: Insufficient documentation

## 2023-03-17 LAB — MYOCARDIAL PERFUSION IMAGING
Angina Index: 0
Duke Treadmill Score: 6
Estimated workload: 7
Exercise duration (min): 5 min
Exercise duration (sec): 38 s
LV dias vol: 43 mL (ref 62–150)
LV sys vol: 12 mL
Nuc Stress EF: 72 %
Peak HR: 137 {beats}/min
Percent HR: 88 %
Rest HR: 71 {beats}/min
Rest Nuclear Isotope Dose: 8 mCi
SDS: 1
SRS: 1
SSS: 2
ST Depression (mm): 0 mm
Stress Nuclear Isotope Dose: 25.1 mCi
TID: 0.84

## 2023-03-17 MED ORDER — TECHNETIUM TC 99M TETROFOSMIN IV KIT
25.1000 | PACK | Freq: Once | INTRAVENOUS | Status: AC | PRN
  Administered 2023-03-17: 25.1 via INTRAVENOUS

## 2023-03-17 MED ORDER — TECHNETIUM TC 99M TETROFOSMIN IV KIT
8.0000 | PACK | Freq: Once | INTRAVENOUS | Status: AC | PRN
  Administered 2023-03-17: 8 via INTRAVENOUS

## 2023-03-17 NOTE — Progress Notes (Signed)
Exercise nuclear stress test 03/17/2023:    The study is normal. The study is low risk.   A Bruce protocol stress test was performed. Exercise capacity was normal. Patient exercised for 5 min and 38 sec. Maximum HR of 137 bpm. MPHR 88.0%. Peak METS 7.0. The patient experienced no angina during the test. The patient reported no symptoms during the stress test.  The ECG was negative for ischemia.   LV perfusion is normal. There is no evidence of ischemia. There is no evidence of infarction.   Left ventricular function is normal. Nuclear stress EF: 72%. The left ventricular ejection fraction is hyperdynamic (>65%). End diastolic cavity size is normal. End systolic cavity size is normal. No evidence of transient ischemic dilation (TID) noted.   Prior study not available for comparison.

## 2023-03-19 DIAGNOSIS — Z0000 Encounter for general adult medical examination without abnormal findings: Secondary | ICD-10-CM | POA: Diagnosis not present

## 2023-03-19 DIAGNOSIS — R7303 Prediabetes: Secondary | ICD-10-CM | POA: Diagnosis not present

## 2023-03-19 DIAGNOSIS — I10 Essential (primary) hypertension: Secondary | ICD-10-CM | POA: Diagnosis not present

## 2023-03-19 DIAGNOSIS — I25118 Atherosclerotic heart disease of native coronary artery with other forms of angina pectoris: Secondary | ICD-10-CM | POA: Diagnosis not present

## 2023-03-19 DIAGNOSIS — Z23 Encounter for immunization: Secondary | ICD-10-CM | POA: Diagnosis not present

## 2023-03-19 DIAGNOSIS — E782 Mixed hyperlipidemia: Secondary | ICD-10-CM | POA: Diagnosis not present

## 2023-03-20 ENCOUNTER — Ambulatory Visit (HOSPITAL_COMMUNITY): Payer: PPO | Attending: Internal Medicine

## 2023-03-20 DIAGNOSIS — Z951 Presence of aortocoronary bypass graft: Secondary | ICD-10-CM | POA: Insufficient documentation

## 2023-03-20 DIAGNOSIS — I2510 Atherosclerotic heart disease of native coronary artery without angina pectoris: Secondary | ICD-10-CM | POA: Insufficient documentation

## 2023-03-20 LAB — ECHOCARDIOGRAM COMPLETE
Area-P 1/2: 4.6 cm2
S' Lateral: 2 cm

## 2023-03-20 NOTE — Progress Notes (Signed)
Echocardiogram 03/20/2023:   1. Left ventricular ejection fraction, by estimation, is 55 to 60%. The left ventricle has normal function. The left ventricle has no regional wall motion abnormalities. There is mild asymmetric left ventricular hypertrophy of the basal-septal segment.  Left ventricular diastolic parameters are consistent with Grade II diastolic dysfunction (pseudonormalization). The average left ventricular global longitudinal strain is -18.9 %. The global longitudinal strain is normal.  2. Right ventricular systolic function is mildly reduced. The right ventricular size is normal. There is normal pulmonary artery systolic pressure. The estimated right ventricular systolic pressure is 17.6 mmHg.  3. The mitral valve is normal in structure. No evidence of mitral valve regurgitation. No evidence of mitral stenosis.  4. The aortic valve is tricuspid. There is mild calcification of the aortic valve. Aortic valve regurgitation is not visualized. No aortic stenosis is present.  5. The inferior vena cava is normal in size with greater than 50% respiratory variability, suggesting right atrial pressure of 3 mmHg.

## 2023-04-24 ENCOUNTER — Other Ambulatory Visit: Payer: Self-pay | Admitting: Cardiology

## 2023-04-29 ENCOUNTER — Encounter (HOSPITAL_COMMUNITY): Payer: Self-pay

## 2023-04-29 ENCOUNTER — Observation Stay (HOSPITAL_COMMUNITY)
Admission: EM | Admit: 2023-04-29 | Discharge: 2023-05-01 | Disposition: A | Discharge status: Home / Self Care | Payer: PPO | Attending: Internal Medicine | Admitting: Internal Medicine

## 2023-04-29 ENCOUNTER — Other Ambulatory Visit: Payer: Self-pay

## 2023-04-29 ENCOUNTER — Telehealth: Payer: Self-pay | Admitting: Cardiology

## 2023-04-29 ENCOUNTER — Emergency Department (HOSPITAL_COMMUNITY): Payer: PPO

## 2023-04-29 DIAGNOSIS — J069 Acute upper respiratory infection, unspecified: Secondary | ICD-10-CM | POA: Diagnosis not present

## 2023-04-29 DIAGNOSIS — R002 Palpitations: Secondary | ICD-10-CM | POA: Diagnosis not present

## 2023-04-29 DIAGNOSIS — I2510 Atherosclerotic heart disease of native coronary artery without angina pectoris: Secondary | ICD-10-CM | POA: Diagnosis not present

## 2023-04-29 DIAGNOSIS — Z7902 Long term (current) use of antithrombotics/antiplatelets: Secondary | ICD-10-CM | POA: Diagnosis not present

## 2023-04-29 DIAGNOSIS — Z1152 Encounter for screening for COVID-19: Secondary | ICD-10-CM | POA: Insufficient documentation

## 2023-04-29 DIAGNOSIS — R778 Other specified abnormalities of plasma proteins: Secondary | ICD-10-CM | POA: Diagnosis not present

## 2023-04-29 DIAGNOSIS — R7989 Other specified abnormal findings of blood chemistry: Secondary | ICD-10-CM | POA: Diagnosis not present

## 2023-04-29 DIAGNOSIS — Z951 Presence of aortocoronary bypass graft: Secondary | ICD-10-CM | POA: Diagnosis not present

## 2023-04-29 DIAGNOSIS — R42 Dizziness and giddiness: Secondary | ICD-10-CM | POA: Diagnosis not present

## 2023-04-29 DIAGNOSIS — Z955 Presence of coronary angioplasty implant and graft: Secondary | ICD-10-CM | POA: Insufficient documentation

## 2023-04-29 DIAGNOSIS — Z87891 Personal history of nicotine dependence: Secondary | ICD-10-CM | POA: Diagnosis not present

## 2023-04-29 DIAGNOSIS — I10 Essential (primary) hypertension: Secondary | ICD-10-CM | POA: Diagnosis present

## 2023-04-29 DIAGNOSIS — R000 Tachycardia, unspecified: Secondary | ICD-10-CM | POA: Diagnosis not present

## 2023-04-29 DIAGNOSIS — K219 Gastro-esophageal reflux disease without esophagitis: Secondary | ICD-10-CM | POA: Diagnosis present

## 2023-04-29 DIAGNOSIS — I4510 Unspecified right bundle-branch block: Secondary | ICD-10-CM | POA: Diagnosis not present

## 2023-04-29 DIAGNOSIS — B348 Other viral infections of unspecified site: Secondary | ICD-10-CM | POA: Diagnosis not present

## 2023-04-29 DIAGNOSIS — R0689 Other abnormalities of breathing: Secondary | ICD-10-CM | POA: Diagnosis not present

## 2023-04-29 DIAGNOSIS — R0602 Shortness of breath: Secondary | ICD-10-CM | POA: Diagnosis not present

## 2023-04-29 DIAGNOSIS — F32A Depression, unspecified: Secondary | ICD-10-CM | POA: Diagnosis present

## 2023-04-29 DIAGNOSIS — R9431 Abnormal electrocardiogram [ECG] [EKG]: Secondary | ICD-10-CM | POA: Diagnosis not present

## 2023-04-29 DIAGNOSIS — Z79899 Other long term (current) drug therapy: Secondary | ICD-10-CM | POA: Diagnosis not present

## 2023-04-29 LAB — MAGNESIUM: Magnesium: 1.9 mg/dL (ref 1.7–2.4)

## 2023-04-29 LAB — CBC WITH DIFFERENTIAL/PLATELET
Abs Immature Granulocytes: 0.01 10*3/uL (ref 0.00–0.07)
Basophils Absolute: 0 10*3/uL (ref 0.0–0.1)
Basophils Relative: 1 %
Eosinophils Absolute: 0.1 10*3/uL (ref 0.0–0.5)
Eosinophils Relative: 2 %
HCT: 45.4 % (ref 39.0–52.0)
Hemoglobin: 15.7 g/dL (ref 13.0–17.0)
Immature Granulocytes: 0 %
Lymphocytes Relative: 27 %
Lymphs Abs: 1.6 10*3/uL (ref 0.7–4.0)
MCH: 32.7 pg (ref 26.0–34.0)
MCHC: 34.6 g/dL (ref 30.0–36.0)
MCV: 94.6 fL (ref 80.0–100.0)
Monocytes Absolute: 0.5 10*3/uL (ref 0.1–1.0)
Monocytes Relative: 8 %
Neutro Abs: 3.7 10*3/uL (ref 1.7–7.7)
Neutrophils Relative %: 62 %
Platelets: 229 10*3/uL (ref 150–400)
RBC: 4.8 MIL/uL (ref 4.22–5.81)
RDW: 12.3 % (ref 11.5–15.5)
WBC: 5.9 10*3/uL (ref 4.0–10.5)
nRBC: 0 % (ref 0.0–0.2)

## 2023-04-29 LAB — BASIC METABOLIC PANEL
Anion gap: 10 (ref 5–15)
BUN: 10 mg/dL (ref 8–23)
CO2: 21 mmol/L — ABNORMAL LOW (ref 22–32)
Calcium: 9 mg/dL (ref 8.9–10.3)
Chloride: 108 mmol/L (ref 98–111)
Creatinine, Ser: 0.83 mg/dL (ref 0.61–1.24)
GFR, Estimated: >60 mL/min (ref >60)
Glucose, Bld: 111 mg/dL — ABNORMAL HIGH (ref 70–99)
Potassium: 3.5 mmol/L (ref 3.5–5.1)
Sodium: 139 mmol/L (ref 135–145)

## 2023-04-29 LAB — TROPONIN I (HIGH SENSITIVITY)
Troponin I (High Sensitivity): 18 ng/L — ABNORMAL HIGH (ref <18)
Troponin I (High Sensitivity): 30 ng/L — ABNORMAL HIGH (ref <18)
Troponin I (High Sensitivity): 36 ng/L — ABNORMAL HIGH (ref <18)
Troponin I (High Sensitivity): 32 ng/L — ABNORMAL HIGH (ref <18)

## 2023-04-29 LAB — D-DIMER, QUANTITATIVE: D-Dimer, Quant: <0.27 ug{FEU}/mL (ref 0.00–0.50)

## 2023-04-29 LAB — TSH: TSH: 0.871 u[IU]/mL (ref 0.350–4.500)

## 2023-04-29 MED ORDER — PANTOPRAZOLE SODIUM 40 MG PO TBEC
40.0000 mg | DELAYED_RELEASE_TABLET | Freq: Every day | ORAL | Status: DC
  Administered 2023-04-30 – 2023-05-01 (×3): 40 mg via ORAL
  Filled 2023-04-29 (×3): qty 1

## 2023-04-29 MED ORDER — EZETIMIBE 10 MG PO TABS
10.0000 mg | ORAL_TABLET | Freq: Every day | ORAL | Status: DC
  Administered 2023-04-30 (×2): 10 mg via ORAL
  Filled 2023-04-29 (×2): qty 1

## 2023-04-29 MED ORDER — ACETAMINOPHEN 325 MG PO TABS
650.0000 mg | ORAL_TABLET | Freq: Four times a day (QID) | ORAL | Status: DC | PRN
  Administered 2023-04-30 (×2): 650 mg via ORAL
  Filled 2023-04-29 (×2): qty 2

## 2023-04-29 MED ORDER — ACETAMINOPHEN 325 MG PO TABS
650.0000 mg | ORAL_TABLET | Freq: Four times a day (QID) | ORAL | Status: DC | PRN
Start: 2023-04-29 — End: 2023-04-29

## 2023-04-29 MED ORDER — ENOXAPARIN SODIUM 40 MG/0.4ML IJ SOSY
40.0000 mg | PREFILLED_SYRINGE | INTRAMUSCULAR | Status: DC
  Administered 2023-04-30 (×2): 40 mg via SUBCUTANEOUS
  Filled 2023-04-29 (×2): qty 0.4

## 2023-04-29 MED ORDER — CLOPIDOGREL BISULFATE 75 MG PO TABS
75.0000 mg | ORAL_TABLET | Freq: Every day | ORAL | Status: DC
  Administered 2023-04-30 – 2023-05-01 (×3): 75 mg via ORAL
  Filled 2023-04-29 (×3): qty 1

## 2023-04-29 MED ORDER — SODIUM CHLORIDE 0.9 % IV BOLUS
1000.0000 mL | Freq: Once | INTRAVENOUS | Status: AC
  Administered 2023-04-29: 1000 mL via INTRAVENOUS

## 2023-04-29 MED ORDER — ATORVASTATIN CALCIUM 80 MG PO TABS
80.0000 mg | ORAL_TABLET | Freq: Every day | ORAL | Status: DC
  Administered 2023-04-30 – 2023-05-01 (×3): 80 mg via ORAL
  Filled 2023-04-29 (×3): qty 1

## 2023-04-29 MED ORDER — BISOPROLOL FUMARATE 5 MG PO TABS
5.0000 mg | ORAL_TABLET | Freq: Every day | ORAL | Status: DC
Start: 2023-04-29 — End: 2023-05-01
  Administered 2023-04-30 (×2): 5 mg via ORAL
  Filled 2023-04-29 (×3): qty 1

## 2023-04-29 MED ORDER — MAGNESIUM SULFATE IN D5W 1-5 GM/100ML-% IV SOLN
1.0000 g | Freq: Once | INTRAVENOUS | Status: AC
  Administered 2023-04-30: 1 g via INTRAVENOUS
  Filled 2023-04-29: qty 100

## 2023-04-29 MED ORDER — SERTRALINE HCL 50 MG PO TABS
25.0000 mg | ORAL_TABLET | Freq: Every day | ORAL | Status: DC
  Administered 2023-04-30 (×2): 25 mg via ORAL
  Filled 2023-04-29 (×2): qty 1

## 2023-04-29 MED ORDER — POTASSIUM CHLORIDE CRYS ER 20 MEQ PO TBCR
60.0000 meq | EXTENDED_RELEASE_TABLET | Freq: Once | ORAL | Status: AC
  Administered 2023-04-30: 60 meq via ORAL
  Filled 2023-04-29: qty 3

## 2023-04-29 MED ORDER — SODIUM CHLORIDE 0.9 % IV SOLN: INTRAVENOUS | Status: DC

## 2023-04-29 NOTE — H&P (Signed)
History and Physical    [name]:[id number] DOB: [birth year] DOA: 04/29/2023  PCP: [name] Fick, MD  Patient coming from: Home  Chief Complaint: Chest palpitations  HPI: [name] is a 67 y.o. male with medical history significant of CAD status post CABG in 2006, hypertension, hyperlipidemia, GERD, depression, anxiety presented ED with a chief complaint of chest palpitations.  He took 1 nitroglycerin at home prior to arrival.  Heart rate 140 initially with EMS.  Not hypoxic or hypotensive.  Afebrile.  Patient was given 400 mL IV fluids prior to arrival.  EKG showing sinus tachycardia and no acute ischemic changes.  Chest x-ray showing no active cardiopulmonary disease.  Labs showing no leukocytosis or anemia, potassium 3.5, bicarb 21, glucose 111, creatinine 0.8, magnesium 1.9, high-sensitivity troponin 18> 30> 36, TSH normal, D-dimer normal.  Patient was given 1 L normal saline.  ED PA discussed the case with cardiologist Dr. Wyline Mood who recommended admitting the patient for observation, telemetry monitoring, and trending troponin.  Repeat troponin ordered and pending.  TRH called to admit.  Patient reports having upper respiratory infection symptoms for the past few days including runny nose, sore throat, and cough.  His oral intake has been poor due to some nausea and he has not been able to take his home medications including bisoprolol for the past 2 days.  Today in the morning as he was taking his dog for a walk, he started having chest palpitations and lightheadedness.  This prompted him to come into the ED to be evaluated.  Denies shortness of breath or any chest pain/pressure.  Denies history of A-fib or arrhythmia.  Denies fevers.  Denies vomiting, abdominal pain, or diarrhea.  Patient states his nausea and URI symptoms have now improved and he was able to tolerate p.o. intake in the ED.  Review of Systems:  Review of Systems  All other systems reviewed and are  negative.   Past Medical History:  Diagnosis Date   Allergy    Anxiety    Colon polyps    Coronary artery disease    Depression    GERD (gastroesophageal reflux disease)    History of open heart surgery    Hypercholesteremia    Hypertension    Myocardial infarction [facility]) 2008    Past Surgical History:  Procedure Laterality Date   ANGIOPLASTY     stent placement   CORONARY ARTERY BYPASS GRAFT     3x   LEG SURGERY Left      reports that he quit smoking about 18 years ago. His smoking use included cigarettes. He has never used smokeless tobacco. He reports that he does not drink alcohol and does not use drugs.  No Known Allergies  Family History  Problem Relation Age of Onset   Healthy Mother    Skin cancer Father    Heart disease Brother    Heart disease Brother    Healthy Maternal Grandmother    Healthy Maternal Grandfather    Healthy Paternal Grandmother    Healthy Paternal Grandfather    Colon cancer Neg Hx    Stomach cancer Neg Hx    Esophageal cancer Neg Hx     Prior to Admission medications   Medication Sig Start Date End Date Taking? Authorizing Provider  acetaminophen (TYLENOL) 500 MG tablet Take 1,000 mg by mouth every 6 (six) hours as needed for moderate pain (pain score 4-6).   Yes [provider]  atorvastatin (LIPITOR) 80 MG tablet TAKE 1  TABLET BY MOUTH EVERY DAY 04/27/23  Yes [name] Decamp, MD  bisoprolol (ZEBETA) 5 MG tablet Take 5 mg by mouth daily after supper. 01/27/19  Yes [provider]  Chlorphen-PE-Acetaminophen (CORICIDIN D COLD/FLU/SINUS PO) Take 1 capsule by mouth daily as needed (Cough/Cold sx).   Yes [provider]  clopidogrel (PLAVIX) 75 MG tablet Take 75 mg by mouth daily.   Yes [provider]  cyclobenzaprine (FLEXERIL) 10 MG tablet Take 1 tablet (10 mg total) by mouth 2 (two) times daily as needed for muscle spasms. Patient taking differently: Take 10 mg by mouth at bedtime. 09/17/21  Yes Curatolo,  Adam, DO  ezetimibe (ZETIA) 10 MG tablet TAKE 1 TABLET (10 MG TOTAL) BY MOUTH DAILY AFTER SUPPER. 03/18/22 04/29/23 Yes [name] Decamp, MD  meloxicam (MOBIC) 15 MG tablet Take 15 mg by mouth daily as needed for pain. 07/21/22  Yes [provider]  Multiple Vitamin (MULITIVITAMIN WITH MINERALS) TABS Take 1 tablet by mouth daily.   Yes [provider]  nitroGLYCERIN (NITROSTAT) 0.4 MG SL tablet PLACE 1 TABLET UNDER THE TONGUE EVERY 5 MINUTES AS NEEDED. FOR CHEST PAIN 03/25/22  Yes [name] Decamp, MD  pantoprazole (PROTONIX) 40 MG tablet Take 40 mg by mouth daily.   Yes [provider]  Phenol (CHLORASEPTIC MT) Use as directed 1 spray in the mouth or throat as needed (Pain).   Yes [provider]  sertraline (ZOLOFT) 50 MG tablet Take 25 mg by mouth at bedtime.   Yes [provider]    Physical Exam: Vitals:   04/29/23 1650 04/29/23 1745 04/29/23 1800 04/29/23 1945  BP: (!) 163/78 (!) 150/81 (!) 158/96 (!) 155/91  Pulse: (!) 102 99 98 99  Resp: 18 15 17 18   Temp:      TempSrc:      SpO2: 99% 99% 100% 100%    Physical Exam Vitals reviewed.  Constitutional:      General: He is not in acute distress. HENT:     Head: Normocephalic and atraumatic.  Eyes:     Extraocular Movements: Extraocular movements intact.  Cardiovascular:     Rate and Rhythm: Regular rhythm. Tachycardia present.     Pulses: Normal pulses.  Pulmonary:     Effort: Pulmonary effort is normal. No respiratory distress.     Breath sounds: Normal breath sounds. No wheezing or rales.  Abdominal:     General: Bowel sounds are normal. There is no distension.     Palpations: Abdomen is soft.     Tenderness: There is no abdominal tenderness. There is no guarding.  Musculoskeletal:        General: No swelling or tenderness.     Cervical back: Normal range of motion.     Right lower leg: No edema.     Left lower leg: No edema.  Skin:    General: Skin is warm and dry.  Neurological:      General: No focal deficit present.     Mental Status: He is alert and oriented to person, place, and time.     Labs on Admission: I have personally reviewed following labs and imaging studies  CBC: Recent Labs  Lab 04/29/23 1000  WBC 5.9  NEUTROABS 3.7  HGB 15.7  HCT 45.4  MCV 94.6  PLT 229   Basic Metabolic Panel: Recent Labs  Lab 04/29/23 1000  NA 139  K 3.5  CL 108  CO2 21*  GLUCOSE 111*  BUN 10  CREATININE 0.83  CALCIUM 9.0  MG 1.9   GFR: CrCl cannot be calculated (Unknown ideal weight.). Liver Function Tests: No results for input(s): "AST", "ALT", "ALKPHOS", "BILITOT", "PROT", "ALBUMIN" in the last 168 hours. No results for input(s): "LIPASE", "AMYLASE" in the last 168 hours. No results for input(s): "AMMONIA" in the last 168 hours. Coagulation Profile: No results for input(s): "INR", "PROTIME" in the last 168 hours. Cardiac Enzymes: No results for input(s): "CKTOTAL", "CKMB", "CKMBINDEX", "TROPONINI" in the last 168 hours. BNP (last 3 results) No results for input(s): "PROBNP" in the last 8760 hours. HbA1C: No results for input(s): "HGBA1C" in the last 72 hours. CBG: No results for input(s): "GLUCAP" in the last 168 hours. Lipid Profile: No results for input(s): "CHOL", "HDL", "LDLCALC", "TRIG", "CHOLHDL", "LDLDIRECT" in the last 72 hours. Thyroid Function Tests: Recent Labs    04/29/23 1620  TSH 0.871   Anemia Panel: No results for input(s): "VITAMINB12", "FOLATE", "FERRITIN", "TIBC", "IRON", "RETICCTPCT" in the last 72 hours. Urine analysis:    Component Value Date/Time   COLORURINE YELLOW 08/16/2011 0936   APPEARANCEUR CLEAR 08/16/2011 0936   LABSPEC 1.003 (L) 08/16/2011 0936   PHURINE 6.5 08/16/2011 0936   GLUCOSEU NEGATIVE 08/16/2011 0936   HGBUR NEGATIVE 08/16/2011 0936   BILIRUBINUR NEGATIVE 08/16/2011 0936   KETONESUR NEGATIVE 08/16/2011 0936   PROTEINUR NEGATIVE 08/16/2011 0936   UROBILINOGEN 0.2 08/16/2011 0936   NITRITE NEGATIVE  08/16/2011 0936   LEUKOCYTESUR NEGATIVE 08/16/2011 0936    Radiological Exams on Admission: DG Chest 2 View  Result Date: 04/29/2023 CLINICAL DATA:  Palpitations, shortness of breath, and dizziness while walking dog EXAM: CHEST - 2 VIEW COMPARISON:  Chest radiograph dated 03/19/2013 FINDINGS: Normal lung volumes. No focal consolidations. No pleural effusion or pneumothorax. The heart size and mediastinal contours are within normal limits. Median sternotomy wires are nondisplaced. IMPRESSION: No active cardiopulmonary disease. Electronically Signed   By: Agustin Cree M.D.   On: 04/29/2023 10:45    EKG: Independently reviewed.  Sinus rhythm, QTc 549.  Right bundle branch block is not new.  Assessment and Plan  Sinus tachycardia Suspect this is due to dehydration from poor p.o. intake in the setting of recent URI/nausea and also from patient not being able to take his home beta-blocker for the past 2 days. Heart rate 140 initially with EMS and EKG showing sinus tachycardia.  Chest x-ray showing no active cardiopulmonary disease.  TSH normal.  Not anemic.  PE less likely as D-dimer normal and patient is not hypoxic.  He is satting 100% on room air.  Tachycardia has responded to IV fluids.  Patient was given 1 L IV fluids in the ED and heart rate has now improved to 90-low 100s.  Continue cardiac monitoring, gentle IV fluid hydration overnight, and resume home bisoprolol.  Elevated troponin History of CAD status post CABG in 2006 EKG without acute ischemic changes.  Patient denies any chest pain/pressure.  High-sensitivity troponin elevated but stable and not consistent with ACS (18> 30> 36>32).  Exercise nuclear stress test done 03/17/2023 was low risk.  Continue cardiac monitoring.  Continue home clopidogrel, bisoprolol, atorvastatin, and ezetimibe.  Recent URI No fever or leukocytosis.  Chest x-ray not suggestive of pneumonia.  Patient is reporting improvement of his symptoms.  Will test for COVID  and flu.  QT prolongation QTc 549 on EKG. Monitor electrolytes, keep K>4 and Mag>2.  Avoid QT prolonging drugs and repeat EKG in the morning.  Hypertension Continue bisoprolol.  Hyperlipidemia Continue atorvastatin and ezetimibe.  GERD Continue pantoprazole.  Depression and anxiety Continue sertraline.  DVT prophylaxis: Lovenox Code Status: Full Code (discussed with the patient) Family Communication: Daughter at bedside. Level of care: Telemetry bed Admission status: It is my clinical opinion that referral for OBSERVATION is reasonable and necessary in this patient based on the above information provided. The aforementioned taken together are felt to place the patient at high risk for further clinical deterioration. However, it is anticipated that the patient may be medically stable for discharge from the hospital within 24 to 48 hours.  John Giovanni MD [facility]  If 7PM-7AM, please contact night-coverage [email or web address]  04/29/2023, 8:23 PM

## 2023-04-29 NOTE — ED Provider Notes (Incomplete)
[place] EMERGENCY DEPARTMENT AT [facility] Provider Note   CSN: [id number] Arrival date & time: 04/29/23  7829     History Chief Complaint  Patient presents with  . Palpitations    [name] is a 67 y.o. male with h/o CABG in 2006    Palpitations      Home Medications Prior to Admission medications   Medication Sig Start Date End Date Taking? Authorizing Provider  acetaminophen (TYLENOL) 500 MG tablet Take 1,000 mg by mouth every 6 (six) hours as needed for moderate pain (pain score 4-6).   Yes [provider]  atorvastatin (LIPITOR) 80 MG tablet TAKE 1 TABLET BY MOUTH EVERY DAY 04/27/23  Yes Yates Decamp, MD  bisoprolol (ZEBETA) 5 MG tablet Take 5 mg by mouth daily after supper. 01/27/19  Yes [provider]  Chlorphen-PE-Acetaminophen (CORICIDIN D COLD/FLU/SINUS PO) Take 1 capsule by mouth daily as needed (Cough/Cold sx).   Yes [provider]  clopidogrel (PLAVIX) 75 MG tablet Take 75 mg by mouth daily.   Yes [provider]  cyclobenzaprine (FLEXERIL) 10 MG tablet Take 1 tablet (10 mg total) by mouth 2 (two) times daily as needed for muscle spasms. Patient taking differently: Take 10 mg by mouth at bedtime. 09/17/21  Yes Curatolo, Adam, DO  ezetimibe (ZETIA) 10 MG tablet TAKE 1 TABLET (10 MG TOTAL) BY MOUTH DAILY AFTER SUPPER. 03/18/22 04/29/23 Yes Yates Decamp, MD  meloxicam (MOBIC) 15 MG tablet Take 15 mg by mouth daily as needed for pain. 07/21/22  Yes [provider]  Multiple Vitamin (MULITIVITAMIN WITH MINERALS) TABS Take 1 tablet by mouth daily.   Yes [provider]  nitroGLYCERIN (NITROSTAT) 0.4 MG SL tablet PLACE 1 TABLET UNDER THE TONGUE EVERY 5 MINUTES AS NEEDED. FOR CHEST PAIN 03/25/22  Yes Yates Decamp, MD  pantoprazole (PROTONIX) 40 MG tablet Take 40 mg by mouth daily.   Yes [provider]  Phenol (CHLORASEPTIC MT) Use as directed 1 spray in the mouth or throat as needed (Pain).   Yes  [provider]  sertraline (ZOLOFT) 50 MG tablet Take 25 mg by mouth at bedtime.   Yes [provider]      Allergies    Patient has no known allergies.    Review of Systems   Review of Systems  Cardiovascular:  Positive for palpitations.    Physical Exam Updated Vital Signs BP (!) 155/91   Pulse 99   Temp 98.3 F (36.8 C) (Oral)   Resp 18   SpO2 100%  Physical Exam  ED Results / Procedures / Treatments   Labs (all labs ordered are listed, but only abnormal results are displayed) Labs Reviewed  BASIC METABOLIC PANEL - Abnormal; Notable for the following components:      Result Value   CO2 21 (*)    Glucose, Bld 111 (*)    All other components within normal limits  TROPONIN I (HIGH SENSITIVITY) - Abnormal; Notable for the following components:   Troponin I (High Sensitivity) 18 (*)    All other components within normal limits  TROPONIN I (HIGH SENSITIVITY) - Abnormal; Notable for the following components:   Troponin I (High Sensitivity) 30 (*)    All other components within normal limits  TROPONIN I (HIGH SENSITIVITY) - Abnormal; Notable for the following components:   Troponin I (High Sensitivity) 36 (*)    All other components within normal limits  CBC WITH DIFFERENTIAL/PLATELET  MAGNESIUM  TSH  D-DIMER,  QUANTITATIVE  TROPONIN I (HIGH SENSITIVITY)  TROPONIN I (HIGH SENSITIVITY)    EKG EKG Interpretation Date/Time:  Wednesday April 29 2023 09:57:35 EST Ventricular Rate:  104 PR Interval:  148 QRS Duration:  152 QT Interval:  404 QTC Calculation: 531 R Axis:   84  Text Interpretation: Sinus tachycardia Right bundle branch block Abnormal ECG When compared with ECG of 04-Mar-2023 09:15, No significant change since last tracing Confirmed by Alvino Blood ([id number]) on 04/29/2023 4:16:04 PM  Radiology DG Chest 2 View  Result Date: 04/29/2023 CLINICAL DATA:  Palpitations, shortness of breath, and dizziness while walking dog EXAM: CHEST -  2 VIEW COMPARISON:  Chest radiograph dated 03/19/2013 FINDINGS: Normal lung volumes. No focal consolidations. No pleural effusion or pneumothorax. The heart size and mediastinal contours are within normal limits. Median sternotomy wires are nondisplaced. IMPRESSION: No active cardiopulmonary disease. Electronically Signed   By: Agustin Cree M.D.   On: 04/29/2023 10:45    Procedures Procedures   Medications Ordered in ED Medications  sodium chloride 0.9 % bolus 1,000 mL (0 mLs Intravenous Stopped 04/29/23 1828)    ED Course/ Medical Decision Making/ A&P Clinical Course as of 04/29/23 1958  Wed Apr 29, 2023  1647 Nursing aware that the repeat troponin is due  [RR]  1736 Labs are still in process.  [RR]    Clinical Course User Index [RR] Achille Rich, PA-C                               Medical Decision Making Amount and/or Complexity of Data Reviewed Labs: ordered.  Risk Decision regarding hospitalization.   67 y.o. male presents to the ER for evaluation of palpitations with lightheadedness. Differential diagnosis includes but is not limited to Cardiac arrhythmias, ACS, CHF, pericarditis, valvular disease, panic/anxiety, ETOH, stimulant use, medication side effect, anemia, hyperthyroidism, pulmonary embolism. Vital signs mild tachycardia, mildly elevated BP, otherwise unremarkable. Physical exam as noted above.   On review of chart evaluation, patient was recently seen in October of this year.  He had a stress test done that showed grade 2 diastolic dysfunction otherwise unremarkable.   I independently reviewed and interpreted the patient's labs.  CBC without leukocytosis or anemia.  Magnesium within normal months.  BMP shows mildly decreased bicarb 21, glucose at 111, otherwise no electrolyte abnormality.  D-dimer undetectable.  Troponin mildly elevated at 18 to 30 to 36. Fourth pending. TSH within normal limits.   CXR shows no acute cardiopulmonary process.   EKG reviewed and  interpreted by my attending and read as Sinus tachycardia Right bundle branch block Abnormal ECG When compared with ECG of 04-Mar-2023 09:15, No significant change since last tracing.  Tachycardia improved from EMS and in the upper 90s to upper 100s. Denies any chest pain, SOB, or palpitations.   In consulted cardiology and spoke with Dr. Tereso Newcomer given that the patient is still borderline tachycardic and has elevating troponins, however they are minimal. She reports that the patient can be discharge or admitted for observation and if any changes, can be re-consulted in the morning.   I discussed the options with the patient and family at patient. They would like to stay for observation which I think is reasonable.   Admit to [facility] for further workup/observation.   Portions of this report may have been transcribed using voice recognition software. Every effort was made to ensure accuracy; however, inadvertent computerized transcription errors may be present.  I discussed this case with my attending physician who cosigned this note including patient's presenting symptoms, physical exam, and planned diagnostics and interventions. Attending physician stated agreement with plan or made changes to plan which were implemented.   Final Clinical Impression(s) / ED Diagnoses Final diagnoses:  Palpitations  Elevated troponin    Rx / DC Orders ED Discharge Orders     None

## 2023-04-29 NOTE — ED Provider Notes (Signed)
[place] EMERGENCY DEPARTMENT AT [facility] Provider Note   CSN: [id number] Arrival date & time: 04/29/23  6433     History Chief Complaint  Patient presents with   Palpitations    [name] is a 67 y.o. male with h/o CABG in 2006 presents to the ER today for evaluation of palpitations.  Patient reports has been experiencing palpitations off and on for the past year however been more frequent over the past 2 weeks.  He reports that they were more prevalent and stronger today and lasted around 30 minutes.  Patient reports that he had gotten out of bed and is not happened around 0800.  He is questioning if he has some shortness of breath or not but is not sure.  He denies any chest pain.  He reports the lightheadedness was associated with palpitations.  He is also been struggling with right headedness off and on for the past year.  He is followed up with her primary care doctor about this and was question if he had vertigo however he does not have any room spinning sensation.  Denies any headaches.  He reports an occasional nausea with the lightheadedness but no vomiting or any abdominal pain.  He reports he has 2 cups of coffee in the day.  He reports compliancy with his medications. No recent changes. No long travel, denies any leg swelling or hemoptysis.  No exogenous hormone use.  Denies any allergies to medications.  Denies any tobacco, EtOH, other drug use.   Palpitations Associated symptoms: no chest pain, no cough, no shortness of breath and no vomiting        Home Medications Prior to Admission medications   Medication Sig Start Date End Date Taking? Authorizing Provider  acetaminophen (TYLENOL) 500 MG tablet Take 1,000 mg by mouth every 6 (six) hours as needed for moderate pain (pain score 4-6).   Yes [provider]  atorvastatin (LIPITOR) 80 MG tablet TAKE 1 TABLET BY MOUTH EVERY DAY 04/27/23  Yes Yates Decamp, MD  bisoprolol (ZEBETA) 5 MG tablet Take 5  mg by mouth daily after supper. 01/27/19  Yes [provider]  Chlorphen-PE-Acetaminophen (CORICIDIN D COLD/FLU/SINUS PO) Take 1 capsule by mouth daily as needed (Cough/Cold sx).   Yes [provider]  clopidogrel (PLAVIX) 75 MG tablet Take 75 mg by mouth daily.   Yes [provider]  cyclobenzaprine (FLEXERIL) 10 MG tablet Take 1 tablet (10 mg total) by mouth 2 (two) times daily as needed for muscle spasms. Patient taking differently: Take 10 mg by mouth at bedtime. 09/17/21  Yes Curatolo, Adam, DO  ezetimibe (ZETIA) 10 MG tablet TAKE 1 TABLET (10 MG TOTAL) BY MOUTH DAILY AFTER SUPPER. 03/18/22 04/29/23 Yes Yates Decamp, MD  meloxicam (MOBIC) 15 MG tablet Take 15 mg by mouth daily as needed for pain. 07/21/22  Yes [provider]  Multiple Vitamin (MULITIVITAMIN WITH MINERALS) TABS Take 1 tablet by mouth daily.   Yes [provider]  nitroGLYCERIN (NITROSTAT) 0.4 MG SL tablet PLACE 1 TABLET UNDER THE TONGUE EVERY 5 MINUTES AS NEEDED. FOR CHEST PAIN 03/25/22  Yes Yates Decamp, MD  pantoprazole (PROTONIX) 40 MG tablet Take 40 mg by mouth daily.   Yes [provider]  Phenol (CHLORASEPTIC MT) Use as directed 1 spray in the mouth or throat as needed (Pain).   Yes [provider]  sertraline (ZOLOFT) 50 MG tablet Take 25 mg by mouth at bedtime.   Yes [provider]      Allergies    Patient has no known allergies.    Review of Systems   Review of Systems  Constitutional:  Negative for chills and fever.  HENT:  Negative for congestion and rhinorrhea.   Respiratory:  Negative for cough and shortness of breath.   Cardiovascular:  Positive for palpitations. Negative for chest pain and leg swelling.  Gastrointestinal:  Negative for abdominal pain and vomiting.  Neurological:  Positive for light-headedness. Negative for syncope and headaches.    Physical Exam Updated Vital Signs BP (!) 155/91   Pulse 99   Temp 98.3 F (36.8 C)  (Oral)   Resp 18   SpO2 100%  Physical Exam Constitutional:      General: He is not in acute distress.    Appearance: He is not ill-appearing, toxic-appearing or diaphoretic.  HENT:     Mouth/Throat:     Mouth: Mucous membranes are moist.  Eyes:     General: No visual field deficit or scleral icterus. Cardiovascular:     Rate and Rhythm: Tachycardia present.     Pulses:          Radial pulses are 2+ on the right side and 1+ on the left side.       Dorsalis pedis pulses are 2+ on the right side.       Posterior tibial pulses are 2+ on the right side and 2+ on the left side.  Pulmonary:     Effort: Pulmonary effort is normal. No respiratory distress.     Breath sounds: Normal breath sounds.  Abdominal:     Palpations: Abdomen is soft.     Tenderness: There is no abdominal tenderness. There is no guarding or rebound.  Musculoskeletal:     Right lower leg: No edema.     Left lower leg: No edema.  Skin:    General: Skin is warm and dry.  Neurological:     General: No focal deficit present.     Mental Status: He is alert. Mental status is at baseline.     GCS: GCS eye subscore is 4. GCS verbal subscore is 5. GCS motor subscore is 6.     Cranial Nerves: No cranial nerve deficit, dysarthria or facial asymmetry.     Sensory: No sensory deficit.     Motor: No weakness or pronator drift.     Coordination: Coordination normal. Finger-Nose-Finger Test and Heel to Shin Test normal.     ED Results / Procedures / Treatments   Labs (all labs ordered are listed, but only abnormal results are displayed) Labs Reviewed  BASIC METABOLIC PANEL - Abnormal; Notable for the following components:      Result Value   CO2 21 (*)    Glucose, Bld 111 (*)    All other components within normal limits  TROPONIN I (HIGH SENSITIVITY) - Abnormal; Notable for the following components:   Troponin I (High Sensitivity) 18 (*)    All other components within normal limits  TROPONIN I (HIGH SENSITIVITY) -  Abnormal; Notable for the following components:   Troponin I (High Sensitivity) 30 (*)    All other components within normal limits  TROPONIN I (HIGH SENSITIVITY) - Abnormal; Notable for the following components:   Troponin I (High Sensitivity) 36 (*)    All other components within normal limits  CBC WITH DIFFERENTIAL/PLATELET  MAGNESIUM  TSH  D-DIMER, QUANTITATIVE  TROPONIN I (HIGH SENSITIVITY)  TROPONIN I (HIGH SENSITIVITY)    EKG  EKG Interpretation Date/Time:  Wednesday April 29 2023 09:57:35 EST Ventricular Rate:  104 PR Interval:  148 QRS Duration:  152 QT Interval:  404 QTC Calculation: 531 R Axis:   84  Text Interpretation: Sinus tachycardia Right bundle branch block Abnormal ECG When compared with ECG of 04-Mar-2023 09:15, No significant change since last tracing Confirmed by Alvino Blood ([id number]) on 04/29/2023 4:16:04 PM  Radiology DG Chest 2 View  Result Date: 04/29/2023 CLINICAL DATA:  Palpitations, shortness of breath, and dizziness while walking dog EXAM: CHEST - 2 VIEW COMPARISON:  Chest radiograph dated 03/19/2013 FINDINGS: Normal lung volumes. No focal consolidations. No pleural effusion or pneumothorax. The heart size and mediastinal contours are within normal limits. Median sternotomy wires are nondisplaced. IMPRESSION: No active cardiopulmonary disease. Electronically Signed   By: Agustin Cree M.D.   On: 04/29/2023 10:45    Procedures Procedures   Medications Ordered in ED Medications  sodium chloride 0.9 % bolus 1,000 mL (0 mLs Intravenous Stopped 04/29/23 1828)    ED Course/ Medical Decision Making/ A&P Clinical Course as of 04/29/23 1958  Wed Apr 29, 2023  1647 Nursing aware that the repeat troponin is due  [RR]  1736 Labs are still in process.  [RR]    Clinical Course User Index [RR] Achille Rich, PA-C                               Medical Decision Making Amount and/or Complexity of Data Reviewed Labs: ordered.  Risk Decision regarding  hospitalization.   67 y.o. male presents to the ER for evaluation of palpitations with lightheadedness. Differential diagnosis includes but is not limited to Cardiac arrhythmias, ACS, CHF, pericarditis, valvular disease, panic/anxiety, ETOH, stimulant use, medication side effect, anemia, hyperthyroidism, pulmonary embolism. Vital signs mild tachycardia, mildly elevated BP, otherwise unremarkable. Physical exam as noted above.   On review of chart evaluation, patient was recently seen in October of this year.  He had a stress test done that showed grade 2 diastolic dysfunction otherwise unremarkable.   I independently reviewed and interpreted the patient's labs.  CBC without leukocytosis or anemia.  Magnesium within normal months.  BMP shows mildly decreased bicarb 21, glucose at 111, otherwise no electrolyte abnormality.  D-dimer undetectable.  Troponin mildly elevated at 18 to 30 to 36. Fourth pending. TSH within normal limits.   CXR shows no acute cardiopulmonary process.   EKG reviewed and interpreted by my attending and read as Sinus tachycardia Right bundle branch block Abnormal ECG When compared with ECG of 04-Mar-2023 09:15, No significant change since last tracing.  Tachycardia improved from EMS and in the upper 90s to upper 100s. Denies any chest pain, SOB, or palpitations.   In consulted cardiology and spoke with Dr. Tereso Newcomer given that the patient is still borderline tachycardic and has elevating troponins, however they are minimal. She reports that the patient can be discharge or admitted for observation and if any changes, can be re-consulted in the morning.   I discussed the options with the patient and family at patient. They would like to stay for observation which I think is reasonable.   Admit to [facility] for further workup/observation.   Portions of this report may have been transcribed using voice recognition software. Every effort was made to ensure accuracy;  however, inadvertent computerized transcription errors may be present.   I discussed this case with my attending physician who cosigned this note including  patient's presenting symptoms, physical exam, and planned diagnostics and interventions. Attending physician stated agreement with plan or made changes to plan which were implemented.   Final Clinical Impression(s) / ED Diagnoses Final diagnoses:  Palpitations  Elevated troponin    Rx / DC Orders ED Discharge Orders     None         Achille Rich, PA-C 04/30/23 0006    Lonell Grandchild, MD 04/30/23 1331

## 2023-04-29 NOTE — ED Triage Notes (Signed)
Pt bib ems from home; taking dog for walk starting having chest palpitations, sob, dizziness; took 1 nitroglycerin at home; denies cp, just states feels like heart racing; hx triple bypass, MI; HR 140 initially sinus tach with R BBB; last hr 90, bp 148/80, RR 20, 96% RA, cbg 137; 400 cc fluid given PTA; 20 ga lac

## 2023-04-29 NOTE — Telephone Encounter (Signed)
Daughter Angela Cox) called to let Dr. Jacinto Halim know patient is currently in [facility] ED.

## 2023-04-29 NOTE — ED Provider Triage Note (Signed)
Emergency Medicine Provider Triage Evaluation Note  [name] , a 67 y.o. male  was evaluated in triage.  Pt complains of palpitations while he was walking his dog this morning.  No shortness of breath no chest pain..  Review of Systems  Positive: CABG Negative: PE  Physical Exam  BP 125/87 (BP Location: Right Arm)   Pulse (!) 101   Temp 98.5 F (36.9 C)   Resp 20   SpO2 100%  Gen:   Awake, no distress   Resp:  Normal effort  MSK:   Moves extremities without difficulty  Other:  HR in the low 100's, regular  Medical Decision Making  Medically screening exam initiated at 10:00 AM.  Appropriate orders placed.  [name] was informed that the remainder of the evaluation will be completed by another provider, this initial triage assessment does not replace that evaluation, and the importance of remaining in the ED until their evaluation is complete.  Patient with a history of bypass surgery in the past, no history of arrhythmias comes in with a chief complaint of palpitations.  He was dizzy a few days ago this seems to have resolved.  Will obtain a laboratory evaluation bolus of IV fluids.   Melene Plan, DO 04/29/23 1001

## 2023-04-30 DIAGNOSIS — I10 Essential (primary) hypertension: Secondary | ICD-10-CM

## 2023-04-30 DIAGNOSIS — R7989 Other specified abnormal findings of blood chemistry: Secondary | ICD-10-CM | POA: Diagnosis not present

## 2023-04-30 DIAGNOSIS — K219 Gastro-esophageal reflux disease without esophagitis: Secondary | ICD-10-CM | POA: Diagnosis not present

## 2023-04-30 DIAGNOSIS — F32A Depression, unspecified: Secondary | ICD-10-CM | POA: Diagnosis not present

## 2023-04-30 DIAGNOSIS — R000 Tachycardia, unspecified: Secondary | ICD-10-CM | POA: Diagnosis not present

## 2023-04-30 DIAGNOSIS — R9431 Abnormal electrocardiogram [ECG] [EKG]: Secondary | ICD-10-CM

## 2023-04-30 DIAGNOSIS — I2585 Chronic coronary microvascular dysfunction: Secondary | ICD-10-CM

## 2023-04-30 DIAGNOSIS — B348 Other viral infections of unspecified site: Secondary | ICD-10-CM | POA: Diagnosis not present

## 2023-04-30 LAB — COMPREHENSIVE METABOLIC PANEL
ALT: 48 U/L — ABNORMAL HIGH (ref 0–44)
AST: 36 U/L (ref 15–41)
Albumin: 3.5 g/dL (ref 3.5–5.0)
Alkaline Phosphatase: 86 U/L (ref 38–126)
Anion gap: 9 (ref 5–15)
BUN: 10 mg/dL (ref 8–23)
CO2: 21 mmol/L — ABNORMAL LOW (ref 22–32)
Calcium: 9.1 mg/dL (ref 8.9–10.3)
Chloride: 109 mmol/L (ref 98–111)
Creatinine, Ser: 0.77 mg/dL (ref 0.61–1.24)
GFR, Estimated: >60 mL/min (ref >60)
Glucose, Bld: 98 mg/dL (ref 70–99)
Potassium: 4.3 mmol/L (ref 3.5–5.1)
Sodium: 139 mmol/L (ref 135–145)
Total Bilirubin: 1.2 mg/dL — ABNORMAL HIGH (ref <1.2)
Total Protein: 7 g/dL (ref 6.5–8.1)

## 2023-04-30 LAB — RESPIRATORY PANEL BY PCR

## 2023-04-30 LAB — HIV ANTIBODY (ROUTINE TESTING W REFLEX): HIV Screen 4th Generation wRfx: NONREACTIVE

## 2023-04-30 LAB — SARS CORONAVIRUS 2 BY RT PCR: SARS Coronavirus 2 by RT PCR: NEGATIVE

## 2023-04-30 LAB — MAGNESIUM: Magnesium: 2.5 mg/dL — ABNORMAL HIGH (ref 1.7–2.4)

## 2023-04-30 MED ORDER — SODIUM CHLORIDE 0.9 % IV SOLN: INTRAVENOUS | Status: AC

## 2023-04-30 MED ORDER — FLUTICASONE PROPIONATE 50 MCG/ACT NA SUSP
2.0000 | Freq: Every day | NASAL | Status: DC
  Filled 2023-04-30: qty 16

## 2023-04-30 MED ORDER — NITROGLYCERIN 0.4 MG SL SUBL: 0.4000 mg | SUBLINGUAL_TABLET | SUBLINGUAL | Status: DC | PRN

## 2023-04-30 MED ORDER — LORATADINE 10 MG PO TABS
10.0000 mg | ORAL_TABLET | Freq: Every day | ORAL | Status: DC
  Administered 2023-04-30 – 2023-05-01 (×2): 10 mg via ORAL
  Filled 2023-04-30 (×2): qty 1

## 2023-04-30 MED ORDER — CYCLOBENZAPRINE HCL 10 MG PO TABS: 10.0000 mg | ORAL_TABLET | Freq: Two times a day (BID) | ORAL | Status: DC | PRN

## 2023-04-30 NOTE — Progress Notes (Signed)
[name] NOTE    EDU CRESTO  ZOX:[id number] DOB: 27-Jun-1955 DOA: 04/29/2023 PCP: Georgianne Fick, [name]    Chief Complaint  [name] [name] presents with   Palpitations    Brief Narrative:  [name] [name] 67 year old gentleman history of CAD status post CABG [name], [name] [name], [name] [name], [name] [name], [name] [name] including D-dimer was negative.  [name] [name] noted to have had recent upper respiratory tract symptoms of infection including runny nose sore throat and cough.  [name] [name] admitted, hydrated with IV fluids with some clinical improvement with palpitations.  Respiratory viral panel obtained was positive for rhinovirus.  SARS coronavirus 2 PCR negative.   Assessment & Plan:   Principal Problem:   Sinus tachycardia Active Problems:   Essential [name] [name]   Depression   [name] [name] (gastroesophageal reflux disease)   Elevated troponin   CAD (coronary artery disease)   QT prolongation   Rhinovirus infection  #1 sinus tachycardia -Likely secondary to dehydration from poor oral intake in the setting of rhinovirus infection and not being able to take his home dose beta-blocker x 2 days. -Heart rate noted in the 140s initially with EMS EKG with a sinus tachycardia. -Chest x-ray negative for any acute abnormalities.  TSH within normal limits.  [name] [name] not anemic.  D-dimer normal. -Tachycardia improved with hydration with IV fluids. -Continue IV fluids, supportive care.  2.  Rhinovirus infection -[name] [name] noted to have recent upper respiratory tract infection symptoms including runny nose, cough. -Respiratory viral panel positive for rhinovirus infection.  SARS coronavirus 2 PCR negative. -Claritin, Flonase, Tessalon Perles as needed. -Supportive care.  3.  Elevated troponin/history of CAD status post CABG in [name] -EKG done without acute ischemic changes. -High-sensitivity troponin elevated but flat and not consistent with ACS. -[name] [name]  noted with recent nuclear stress test 03/17/2023 with low risk. -Continue home regimen clopidogrel, bisoprolol, atorvastatin, ezetimibe. -Outpatient follow-up.  4.  QT prolongation -[name] [name] noted on imaging to have a QTc of 549 on EKG. -Repeat EKG with resolution of QTc prolongation. -Replete electrolytes to keep magnesium approximately 2, potassium approximately 4.  5.  [name] [name] -Metoprolol.  6.  [name] [name] Continue atorvastatin and ezetimibe   DVT prophylaxis: Lovenox Code Status: Full Family Communication: Updated [name] [name].  No family at bedside. Disposition: Home when clinically improved hopefully in the next 24 hours.  Status is: Observation The [name] [name] remains OBS appropriate and will d/c before 2 midnights.   Consultants:  None  Procedures:  Chest x-ray 04/29/2023   Antimicrobials:  Anti-infectives (From admission, onward)    None         Subjective: Sitting up in gurney.  Denies any chest pain or shortness of breath.  Denies any further palpitations.  Overall feeling better than when he presented to the ED.  Tolerating current diet.  Objective: Vitals:   04/30/23 0215 04/30/23 0348 04/30/23 0445 04/30/23 0857  BP: 139/84  106/61 119/75  Pulse: 83  90 82  Resp: 18  17 14   Temp:  98 F (36.7 C)  98.1 F (36.7 C)  TempSrc:  Oral    SpO2: 99%  99% 100%    Intake/Output Summary (Last 24 hours) at 04/30/2023 1035 Last data filed at 04/30/2023 0108 Gross per 24 hour  Intake 94.71 ml  Output --  Net 94.71 ml   There were no vitals filed for this visit.  Examination:  General exam: Appears calm and comfortable  Respiratory system: Clear to auscultation.  No wheezes, no crackles, no rhonchi.  Fair air movement.  Speaking in full sentences.  Respiratory effort normal. Cardiovascular system: S1 & S2 heard, RRR. No JVD, murmurs, rubs, gallops or clicks. No pedal edema. Gastrointestinal system: Abdomen is nondistended, soft and nontender. No  organomegaly or masses felt. Normal bowel sounds heard. Central nervous system: Alert and oriented. No focal neurological deficits. Extremities: Symmetric 5 x 5 power. Skin: No rashes, lesions or ulcers Psychiatry: Judgement and insight appear normal. Mood & affect appropriate.     Data Reviewed: I have personally reviewed following labs and imaging studies  CBC: Recent Labs  Lab 04/29/23 1000  WBC 5.9  NEUTROABS 3.7  HGB 15.7  HCT 45.4  MCV 94.6  PLT 229    Basic Metabolic Panel: Recent Labs  Lab 04/29/23 1000 04/30/23 0347  NA 139 139  K 3.5 4.3  CL 108 109  CO2 21* 21*  GLUCOSE 111* 98  BUN 10 10  CREATININE 0.83 0.77  CALCIUM 9.0 9.1  MG 1.9 2.5*    GFR: CrCl cannot be calculated (Unknown ideal weight.).  Liver Function Tests: Recent Labs  Lab 04/30/23 0347  AST 36  ALT 48*  ALKPHOS 86  BILITOT 1.2*  PROT 7.0  ALBUMIN 3.5    CBG: No results for input(s): "GLUCAP" in the last 168 hours.   Recent Results (from the past 240 hour(s))  SARS Coronavirus 2 by RT PCR (hospital order, performed in [facility] hospital lab) *cepheid single result test* Anterior Nasal Swab     Status: None   Collection Time: 04/30/23 12:08 AM   Specimen: Anterior Nasal Swab  Result Value Ref Range Status   SARS Coronavirus 2 by RT PCR NEGATIVE NEGATIVE Final    Comment: Performed at [facility] Lab, [address] [place], [place] [address]  Respiratory (~20 pathogens) panel by PCR     Status: Abnormal   Collection Time: 04/30/23 12:08 AM   Specimen: Anterior Nasal Swab; Respiratory  Result Value Ref Range Status   Adenovirus NOT DETECTED NOT DETECTED Final   Coronavirus 229E NOT DETECTED NOT DETECTED Final    Comment: (NOTE) The Coronavirus on the Respiratory Panel, DOES NOT test for the novel  Coronavirus (2019 nCoV)    Coronavirus HKU1 NOT DETECTED NOT DETECTED Final   Coronavirus NL63 NOT DETECTED NOT DETECTED Final   Coronavirus OC43 NOT DETECTED NOT  DETECTED Final   Metapneumovirus NOT DETECTED NOT DETECTED Final   Rhinovirus / Enterovirus DETECTED (A) NOT DETECTED Final   Influenza A NOT DETECTED NOT DETECTED Final   Influenza B NOT DETECTED NOT DETECTED Final   Parainfluenza Virus 1 NOT DETECTED NOT DETECTED Final   Parainfluenza Virus 2 NOT DETECTED NOT DETECTED Final   Parainfluenza Virus 3 NOT DETECTED NOT DETECTED Final   Parainfluenza Virus 4 NOT DETECTED NOT DETECTED Final   Respiratory Syncytial Virus NOT DETECTED NOT DETECTED Final   Bordetella pertussis NOT DETECTED NOT DETECTED Final   Bordetella Parapertussis NOT DETECTED NOT DETECTED Final   Chlamydophila pneumoniae NOT DETECTED NOT DETECTED Final   Mycoplasma pneumoniae NOT DETECTED NOT DETECTED Final    Comment: Performed at [facility], [address] [place], [place] [address]         Radiology Studies: DG Chest 2 View  Result Date: 04/29/2023 CLINICAL DATA:  Palpitations, shortness of breath, and dizziness while walking dog EXAM: CHEST - 2 VIEW COMPARISON:  Chest radiograph dated 03/19/2013 FINDINGS: Normal lung volumes. No focal consolidations. No pleural effusion or pneumothorax. The heart size and mediastinal  contours are within normal limits. Median sternotomy wires are nondisplaced. IMPRESSION: No active cardiopulmonary disease. Electronically Signed   By: Agustin Cree M.D.   On: 04/29/2023 10:45        Scheduled Meds:  atorvastatin  80 mg Oral Daily   bisoprolol  5 mg Oral QPC supper   clopidogrel  75 mg Oral Daily   enoxaparin (LOVENOX) injection  40 mg Subcutaneous Q24H   ezetimibe  10 mg Oral QPC supper   fluticasone  2 spray Each Nare Daily   loratadine  10 mg Oral Daily   pantoprazole  40 mg Oral Daily   sertraline  25 mg Oral QHS   Continuous Infusions:  sodium chloride 75 mL/hr at 04/30/23 0854     LOS: 0 days    Time spent: 40 minutes    Ramiro Harvest, [name] [facility]   To contact the attending provider between  7A-7P or the covering provider during after hours 7P-7A, please log into the web site [email or web address] and access using universal [place] password for that web site. If you do not have the password, please call the hospital operator.  04/30/2023, 10:35 AM

## 2023-04-30 NOTE — Care Management Obs Status (Signed)
MEDICARE OBSERVATION STATUS NOTIFICATION   Patient Details  Name: TRESTAN MASSIMINO MRN: [id number] Date of Birth: 1955/09/27   Medicare Observation Status Notification Given:  Yes    Oletta Cohn, RN 04/30/2023, 11:37 AM

## 2023-04-30 NOTE — ED Notes (Signed)
ED TO INPATIENT HANDOFF REPORT  ED Nurse Name and Phone #: Victorino Dike [phone]  S Name/Age/Gender [name] 67 y.o. male Room/Bed: 037C/037C  Code Status   Code Status: Full Code  Home/SNF/Other Home Patient oriented to: self, place, time, and situation Is this baseline? Yes   Triage Complete: Triage complete  Chief Complaint Tachycardia [R00.0]  Triage Note Pt bib ems from home; taking dog for walk starting having chest palpitations, sob, dizziness; took 1 nitroglycerin at home; denies cp, just states feels like heart racing; hx triple bypass, MI; HR 140 initially sinus tach with R BBB; last hr 90, bp 148/80, RR 20, 96% RA, cbg 137; 400 cc fluid given PTA; 20 ga lac   Allergies No Known Allergies  Level of Care/Admitting Diagnosis ED Disposition     ED Disposition  Admit   Condition  --   Comment  Hospital Area: [facility] [[id number]]  Level of Care: Telemetry Cardiac [[id number]]  May place patient in observation at [facility] or [facility] if equivalent level of care is available:: Yes  Covid Evaluation: Asymptomatic - no recent exposure (last 10 days) testing not required  Diagnosis: Tachycardia [242249]  Admitting Physician: John Giovanni [[id number]]  Attending Physician: John Giovanni [[id number]]          B Medical/Surgery History Past Medical History:  Diagnosis Date   Allergy    Anxiety    Colon polyps    Coronary artery disease    Depression    GERD (gastroesophageal reflux disease)    History of open heart surgery    Hypercholesteremia    Hypertension    Myocardial infarction (HCC) 2008   Past Surgical History:  Procedure Laterality Date   ANGIOPLASTY     stent placement   CORONARY ARTERY BYPASS GRAFT     3x   LEG SURGERY Left      A IV Location/Drains/Wounds Patient Lines/Drains/Airways Status     Active Line/Drains/Airways     Name Placement date Placement time Site Days   Peripheral IV 04/29/23 20 G Left  Antecubital 04/29/23  --  Antecubital  1            Intake/Output Last 24 hours  Intake/Output Summary (Last 24 hours) at 04/30/2023 1453 Last data filed at 04/30/2023 0108 Gross per 24 hour  Intake 94.71 ml  Output --  Net 94.71 ml    Labs/Imaging Results for orders placed or performed during the hospital encounter of 04/29/23 (from the past 48 hour(s))  CBC with Differential     Status: None   Collection Time: 04/29/23 10:00 AM  Result Value Ref Range   WBC 5.9 4.0 - 10.5 K/uL   RBC 4.80 4.22 - 5.81 MIL/uL   Hemoglobin 15.7 13.0 - 17.0 g/dL   HCT 57.8 46.9 - 62.9 %   MCV 94.6 80.0 - 100.0 fL   MCH 32.7 26.0 - 34.0 pg   MCHC 34.6 30.0 - 36.0 g/dL   RDW 52.8 41.3 - 24.4 %   Platelets 229 150 - 400 K/uL   nRBC 0.0 0.0 - 0.2 %   Neutrophils Relative % 62 %   Neutro Abs 3.7 1.7 - 7.7 K/uL   Lymphocytes Relative 27 %   Lymphs Abs 1.6 0.7 - 4.0 K/uL   Monocytes Relative 8 %   Monocytes Absolute 0.5 0.1 - 1.0 K/uL   Eosinophils Relative 2 %   Eosinophils Absolute 0.1 0.0 - 0.5 K/uL   Basophils Relative 1 %  Basophils Absolute 0.0 0.0 - 0.1 K/uL   Immature Granulocytes 0 %   Abs Immature Granulocytes 0.01 0.00 - 0.07 K/uL    Comment: Performed at [facility], [address] [place], [place] [address]  Basic metabolic panel     Status: Abnormal   Collection Time: 04/29/23 10:00 AM  Result Value Ref Range   Sodium 139 135 - 145 mmol/L   Potassium 3.5 3.5 - 5.1 mmol/L   Chloride 108 98 - 111 mmol/L   CO2 21 (L) 22 - 32 mmol/L   Glucose, Bld 111 (H) 70 - 99 mg/dL    Comment: Glucose reference range applies only to samples taken after fasting for at least 8 hours.   BUN 10 8 - 23 mg/dL   Creatinine, Ser 8.84 0.61 - 1.24 mg/dL   Calcium 9.0 8.9 - 16.6 mg/dL   GFR, Estimated >06 >30 mL/min    Comment: (NOTE) Calculated using the CKD-EPI Creatinine Equation (2021)    Anion gap 10 5 - 15    Comment: Performed at [facility], [address],  [place], [place] [address]  Magnesium     Status: None   Collection Time: 04/29/23 10:00 AM  Result Value Ref Range   Magnesium 1.9 1.7 - 2.4 mg/dL    Comment: Performed at [facility], [address] [place], [place] [address]  Troponin I (High Sensitivity)     Status: Abnormal   Collection Time: 04/29/23 10:00 AM  Result Value Ref Range   Troponin I (High Sensitivity) 18 (H) <18 ng/L    Comment: (NOTE) Elevated high sensitivity troponin I (hsTnI) values and significant  changes across serial measurements may suggest ACS but many other  chronic and acute conditions are known to elevate hsTnI results.  Refer to the "Links" section for chest pain algorithms and additional  guidance. Performed at [facility], [address] [place], [place] [address]   TSH     Status: None   Collection Time: 04/29/23  4:20 PM  Result Value Ref Range   TSH 0.871 0.350 - 4.500 uIU/mL    Comment: Performed by a 3rd Generation assay with a functional sensitivity of <=0.01 uIU/mL. Performed at [facility], [address] [place], [place] [address]   Troponin I (High Sensitivity)     Status: Abnormal   Collection Time: 04/29/23  4:20 PM  Result Value Ref Range   Troponin I (High Sensitivity) 30 (H) <18 ng/L    Comment: (NOTE) Elevated high sensitivity troponin I (hsTnI) values and significant  changes across serial measurements may suggest ACS but many other  chronic and acute conditions are known to elevate hsTnI results.  Refer to the "Links" section for chest pain algorithms and additional  guidance. Performed at [facility], [address] [place], [place] [address]   D-dimer, quantitative     Status: None   Collection Time: 04/29/23  4:20 PM  Result Value Ref Range   D-Dimer, Quant <0.27 0.00 - 0.50 ug/mL-FEU    Comment: (NOTE) At the manufacturer cut-off value of 0.5 g/mL FEU, this assay has a negative predictive value of 95-100%.This assay is intended for use in  conjunction with a clinical pretest probability (PTP) assessment model to exclude pulmonary embolism (PE) and deep venous thrombosis (DVT) in outpatients suspected of PE or DVT. Results should be correlated with clinical presentation. Performed at [facility], [address] [place],  [place] [address]   Troponin I (High Sensitivity)     Status: Abnormal   Collection Time: 04/29/23  6:10 PM  Result Value Ref Range   Troponin I (High Sensitivity) 36 (H) <18 ng/L    Comment: (NOTE) Elevated high sensitivity troponin I (hsTnI) values and significant  changes across serial measurements may suggest ACS but many other  chronic and acute conditions are known to elevate hsTnI results.  Refer to the "Links" section for chest pain algorithms and additional  guidance. Performed at [facility], [address] [place], [place] [address]   Troponin I (High Sensitivity)     Status: Abnormal   Collection Time: 04/29/23  7:39 PM  Result Value Ref Range   Troponin I (High Sensitivity) 32 (H) <18 ng/L    Comment: (NOTE) Elevated high sensitivity troponin I (hsTnI) values and significant  changes across serial measurements may suggest ACS but many other  chronic and acute conditions are known to elevate hsTnI results.  Refer to the "Links" section for chest pain algorithms and additional  guidance. Performed at [facility], [address] [place], [place] [address]   SARS Coronavirus 2 by RT PCR (hospital order, performed in [facility] hospital lab) *cepheid single result test* Anterior Nasal Swab     Status: None   Collection Time: 04/30/23 12:08 AM   Specimen: Anterior Nasal Swab  Result Value Ref Range   SARS Coronavirus 2 by RT PCR NEGATIVE NEGATIVE    Comment: Performed at [facility], [address] [place], [place] [address]  Respiratory (~20 pathogens) panel by PCR     Status: Abnormal   Collection Time: 04/30/23 12:08 AM   Specimen: Anterior Nasal Swab;  Respiratory  Result Value Ref Range   Adenovirus NOT DETECTED NOT DETECTED   Coronavirus 229E NOT DETECTED NOT DETECTED    Comment: (NOTE) The Coronavirus on the Respiratory Panel, DOES NOT test for the novel  Coronavirus (2019 nCoV)    Coronavirus HKU1 NOT DETECTED NOT DETECTED   Coronavirus NL63 NOT DETECTED NOT DETECTED   Coronavirus OC43 NOT DETECTED NOT DETECTED   Metapneumovirus NOT DETECTED NOT DETECTED   Rhinovirus / Enterovirus DETECTED (A) NOT DETECTED   Influenza A NOT DETECTED NOT DETECTED   Influenza B NOT DETECTED NOT DETECTED   Parainfluenza Virus 1 NOT DETECTED NOT DETECTED   Parainfluenza Virus 2 NOT DETECTED NOT DETECTED   Parainfluenza Virus 3 NOT DETECTED NOT DETECTED   Parainfluenza Virus 4 NOT DETECTED NOT DETECTED   Respiratory Syncytial Virus NOT DETECTED NOT DETECTED   Bordetella pertussis NOT DETECTED NOT DETECTED   Bordetella Parapertussis NOT DETECTED NOT DETECTED   Chlamydophila pneumoniae NOT DETECTED NOT DETECTED   Mycoplasma pneumoniae NOT DETECTED NOT DETECTED    Comment: Performed at [facility], [address] [place], [place] [address]  HIV Antibody (routine testing w rflx)     Status: None   Collection Time: 04/30/23  3:47 AM  Result Value Ref Range   HIV Screen 4th Generation wRfx Non Reactive Non Reactive    Comment: Performed at [facility], [address], [place], [place] [address]  Comprehensive metabolic panel     Status: Abnormal   Collection Time: 04/30/23  3:47 AM  Result Value Ref Range   Sodium 139 135 - 145 mmol/L   Potassium 4.3 3.5 - 5.1 mmol/L   Chloride 109 98 - 111 mmol/L   CO2 21 (L) 22 -  32 mmol/L   Glucose, Bld 98 70 - 99 mg/dL    Comment: Glucose reference range applies only to samples taken after fasting for at least 8 hours.   BUN 10 8 - 23 mg/dL   Creatinine, Ser 6.96 0.61 - 1.24 mg/dL   Calcium 9.1 8.9 - 29.5 mg/dL   Total Protein 7.0 6.5 - 8.1 g/dL   Albumin 3.5 3.5 - 5.0 g/dL   AST 36 15 - 41  U/L   ALT 48 (H) 0 - 44 U/L   Alkaline Phosphatase 86 38 - 126 U/L   Total Bilirubin 1.2 (H) <1.2 mg/dL   GFR, Estimated >28 >41 mL/min    Comment: (NOTE) Calculated using the CKD-EPI Creatinine Equation (2021)    Anion gap 9 5 - 15    Comment: Performed at [facility], [address], [place], [place] [address]  Magnesium     Status: Abnormal   Collection Time: 04/30/23  3:47 AM  Result Value Ref Range   Magnesium 2.5 (H) 1.7 - 2.4 mg/dL    Comment: Performed at [facility], [address] [place], [place] [address]   DG Chest 2 View  Result Date: 04/29/2023 CLINICAL DATA:  Palpitations, shortness of breath, and dizziness while walking dog EXAM: CHEST - 2 VIEW COMPARISON:  Chest radiograph dated 03/19/2013 FINDINGS: Normal lung volumes. No focal consolidations. No pleural effusion or pneumothorax. The heart size and mediastinal contours are within normal limits. Median sternotomy wires are nondisplaced. IMPRESSION: No active cardiopulmonary disease. Electronically Signed   By: Agustin Cree M.D.   On: 04/29/2023 10:45    Pending Labs Unresulted Labs (From admission, onward)    None       Vitals/Pain Today's Vitals   04/30/23 0348 04/30/23 0445 04/30/23 0857 04/30/23 1300  BP:  106/61 119/75 137/84  Pulse:  90 82 80  Resp:  17 14 20   Temp: 98 F (36.7 C)  98.1 F (36.7 C)   TempSrc: Oral     SpO2:  99% 100% 97%  PainSc:        Isolation Precautions Droplet precaution  Medications Medications  acetaminophen (TYLENOL) tablet 650 mg (650 mg Oral Given 04/30/23 0005)  atorvastatin (LIPITOR) tablet 80 mg (80 mg Oral Given 04/30/23 0855)  bisoprolol (ZEBETA) tablet 5 mg (5 mg Oral Given 04/30/23 0004)  ezetimibe (ZETIA) tablet 10 mg (10 mg Oral Given 04/30/23 0004)  sertraline (ZOLOFT) tablet 25 mg (25 mg Oral Given 04/30/23 0004)  pantoprazole (PROTONIX) EC tablet 40 mg (40 mg Oral Given 04/30/23 0855)  clopidogrel (PLAVIX) tablet 75 mg (75 mg Oral Given 04/30/23  0854)  enoxaparin (LOVENOX) injection 40 mg (40 mg Subcutaneous Given 04/30/23 0004)  0.9 %  sodium chloride infusion ( Intravenous New Bag/Given 04/30/23 0854)  loratadine (CLARITIN) tablet 10 mg (10 mg Oral Given 04/30/23 0855)  fluticasone (FLONASE) 50 MCG/ACT nasal spray 2 spray (2 sprays Each Nare Not Given 04/30/23 0927)  cyclobenzaprine (FLEXERIL) tablet 10 mg (has no administration in time range)  nitroGLYCERIN (NITROSTAT) SL tablet 0.4 mg (has no administration in time range)  sodium chloride 0.9 % bolus 1,000 mL (0 mLs Intravenous Stopped 04/29/23 1828)  potassium chloride SA (KLOR-CON M) CR tablet 60 mEq (60 mEq Oral Given 04/30/23 0004)  magnesium sulfate IVPB 1 g 100 mL (0 g Intravenous Stopped 04/30/23 0104)    Mobility walks     Focused Assessments Cardiac Assessment Handoff:  Cardiac Rhythm: Normal sinus rhythm Lab Results  Component Value Date   CKTOTAL 33 02/05/2007   CKMB 0.8 02/05/2007   TROPONINI 0.01        NO INDICATION OF MYOCARDIAL INJURY. 02/05/2007   Lab Results  Component Value Date   DDIMER <0.27 04/29/2023   Does the Patient currently have chest pain? No    R Recommendations: See Admitting Provider Note  Report given to:   Additional Notes:

## 2023-05-01 DIAGNOSIS — I10 Essential (primary) hypertension: Secondary | ICD-10-CM | POA: Diagnosis not present

## 2023-05-01 DIAGNOSIS — R000 Tachycardia, unspecified: Secondary | ICD-10-CM | POA: Diagnosis not present

## 2023-05-01 DIAGNOSIS — F32A Depression, unspecified: Secondary | ICD-10-CM | POA: Diagnosis not present

## 2023-05-01 DIAGNOSIS — R7989 Other specified abnormal findings of blood chemistry: Secondary | ICD-10-CM | POA: Diagnosis not present

## 2023-05-01 DIAGNOSIS — K219 Gastro-esophageal reflux disease without esophagitis: Secondary | ICD-10-CM | POA: Diagnosis not present

## 2023-05-01 DIAGNOSIS — I2585 Chronic coronary microvascular dysfunction: Secondary | ICD-10-CM | POA: Diagnosis not present

## 2023-05-01 DIAGNOSIS — R9431 Abnormal electrocardiogram [ECG] [EKG]: Secondary | ICD-10-CM | POA: Diagnosis not present

## 2023-05-01 DIAGNOSIS — B348 Other viral infections of unspecified site: Secondary | ICD-10-CM | POA: Diagnosis not present

## 2023-05-01 LAB — BASIC METABOLIC PANEL
Anion gap: 6 (ref 5–15)
BUN: 7 mg/dL — ABNORMAL LOW (ref 8–23)
CO2: 25 mmol/L (ref 22–32)
Calcium: 8.9 mg/dL (ref 8.9–10.3)
Chloride: 108 mmol/L (ref 98–111)
Creatinine, Ser: 0.69 mg/dL (ref 0.61–1.24)
GFR, Estimated: >60 mL/min (ref >60)
Glucose, Bld: 93 mg/dL (ref 70–99)
Potassium: 3.7 mmol/L (ref 3.5–5.1)
Sodium: 139 mmol/L (ref 135–145)

## 2023-05-01 LAB — MAGNESIUM: Magnesium: 2.1 mg/dL (ref 1.7–2.4)

## 2023-05-01 MED ORDER — BENZONATATE 200 MG PO CAPS
200.0000 mg | ORAL_CAPSULE | Freq: Three times a day (TID) | ORAL | 0 refills | Status: AC | PRN
Start: 2023-05-01 — End: 2024-04-30

## 2023-05-01 MED ORDER — LORATADINE 10 MG PO TABS: 10.0000 mg | ORAL_TABLET | Freq: Every day | ORAL | 0 refills | Status: AC

## 2023-05-01 MED ORDER — FLUTICASONE PROPIONATE 50 MCG/ACT NA SUSP: 2.0000 | Freq: Every day | NASAL | 0 refills | Status: AC

## 2023-05-01 NOTE — Plan of Care (Signed)
  Problem: Health Behavior/Discharge Planning: Goal: Ability to manage health-related needs will improve Outcome: Progressing   Problem: Clinical Measurements: Goal: Ability to maintain clinical measurements within normal limits will improve Outcome: Progressing Goal: Respiratory complications will improve Outcome: Progressing Goal: Cardiovascular complication will be avoided Outcome: Progressing   Problem: Activity: Goal: Risk for activity intolerance will decrease Outcome: Progressing   Problem: Nutrition: Goal: Adequate nutrition will be maintained Outcome: Progressing   Problem: Safety: Goal: Ability to remain free from injury will improve Outcome: Progressing

## 2023-05-01 NOTE — Discharge Summary (Signed)
Physician Discharge Summary  [name] UXL:[id number] DOB: [birth year] DOA: 04/29/2023  PCP: Georgianne Fick, MD  Admit date: 04/29/2023 Discharge date: 05/01/2023  Time spent: 55 minutes  Recommendations for Outpatient Follow-up:  Follow-up with Georgianne Fick, MD in 2 weeks.  On follow-up patient will need a basic metabolic profile, magnesium level done to follow-up on electrolytes and renal function.  Repeat EKG will need to be obtained to follow-up on QTc.   Discharge Diagnoses:  Principal Problem:   Sinus tachycardia Active Problems:   Essential hypertension   Depression   GERD (gastroesophageal reflux disease)   Elevated troponin   CAD (coronary artery disease)   QT prolongation   Rhinovirus infection   Discharge Condition: Stable and improved.  Diet recommendation: Heart healthy  Filed Weights   04/30/23 1834  Weight: 76.7 kg    History of present illness:  HPI per Dr.Rathore [name] is a 67 y.o. male with medical history significant of CAD status post CABG in 2006, hypertension, hyperlipidemia, GERD, depression, anxiety presented ED with a chief complaint of chest palpitations.  He took 1 nitroglycerin at home prior to arrival.  Heart rate 140 initially with EMS.  Not hypoxic or hypotensive.  Afebrile.  Patient was given 400 mL IV fluids prior to arrival.  EKG showing sinus tachycardia and no acute ischemic changes.  Chest x-ray showing no active cardiopulmonary disease.  Labs showing no leukocytosis or anemia, potassium 3.5, bicarb 21, glucose 111, creatinine 0.8, magnesium 1.9, high-sensitivity troponin 18> 30> 36, TSH normal, D-dimer normal.  Patient was given 1 L normal saline.  ED PA discussed the case with cardiologist Dr. Wyline Mood who recommended admitting the patient for observation, telemetry monitoring, and trending troponin.  Repeat troponin ordered and pending.  TRH called to admit.   Patient reports having upper respiratory infection  symptoms for the past few days including runny nose, sore throat, and cough.  His oral intake has been poor due to some nausea and he has not been able to take his home medications including bisoprolol for the past 2 days.  Today in the morning as he was taking his dog for a walk, he started having chest palpitations and lightheadedness.  This prompted him to come into the ED to be evaluated.  Denies shortness of breath or any chest pain/pressure.  Denies history of A-fib or arrhythmia.  Denies fevers.  Denies vomiting, abdominal pain, or diarrhea.  Patient states his nausea and URI symptoms have now improved and he was able to tolerate p.o. intake in the ED.  Hospital Course:  #1 sinus tachycardia -Likely secondary to dehydration from poor oral intake in the setting of rhinovirus infection and not being able to take his home dose beta-blocker x 2 days prior to admission. -Heart rate noted in the 140s initially with EMS EKG with a sinus tachycardia. -Chest x-ray negative for any acute abnormalities.  TSH within normal limits.  Patient not anemic.  D-dimer normal. -Tachycardia improved with hydration with IV fluids and had resolved by day of discharge. -No arrhythmias noted on telemetry during the hospitalization. -Outpatient follow-up with PCP.   2.  Rhinovirus infection -Patient noted to have recent upper respiratory tract infection symptoms including runny nose, cough. -Respiratory viral panel positive for rhinovirus infection.  SARS coronavirus 2 PCR negative. -Patient was placed on Claritin, Flonase, Tessalon Perles as needed. -Patient improved clinically. -Outpatient follow-up.   3.  Elevated troponin/history of CAD status post CABG in 2006 -EKG done without acute ischemic  changes. -High-sensitivity troponin elevated but flat and not consistent with ACS. -Patient noted with recent nuclear stress test 03/17/2023 with low risk. -Patient was maintained on home regimen clopidogrel, bisoprolol,  atorvastatin, ezetimibe. -Outpatient follow-up.   4.  QT prolongation -Patient noted on imaging to have a QTc of 549 on EKG. -Repeat EKG with resolution of QTc prolongation. -Electrolytes were repleted.   -Outpatient follow-up.     5.  Hypertension -Patient maintained on home regimen beta-blocker.     6.  Hyperlipidemia Patient was maintained on home regimen atorvastatin and ezetimibe    Procedures: Chest x-ray 04/29/2023  Consultations: None  Discharge Exam: Vitals:   05/01/23 0403 05/01/23 0749  BP: (!) 156/82 (!) 149/90  Pulse: 74 66  Resp: 16 18  Temp: 98.3 F (36.8 C) 98.1 F (36.7 C)  SpO2: 97% 99%    General: NAD Cardiovascular: RRR no murmurs rubs or gallops.  No JVD.  No lower extremity edema. Respiratory: Lungs clear to auscultation bilaterally.  No wheezes, no crackles, no rhonchi.  Fair air movement.  Speaking in full sentences.  Discharge Instructions   Discharge Instructions     Diet - low sodium heart healthy   Complete by: As directed    Increase activity slowly   Complete by: As directed       Allergies as of 05/01/2023   No Known Allergies      Medication List     TAKE these medications    acetaminophen 500 MG tablet Commonly known as: TYLENOL Take 1,000 mg by mouth every 6 (six) hours as needed for moderate pain (pain score 4-6).   atorvastatin 80 MG tablet Commonly known as: LIPITOR TAKE 1 TABLET BY MOUTH EVERY DAY   benzonatate 200 MG capsule Commonly known as: TESSALON Take 1 capsule (200 mg total) by mouth 3 (three) times daily as needed for cough.   bisoprolol 5 MG tablet Commonly known as: ZEBETA Take 5 mg by mouth daily after supper.   CHLORASEPTIC MT Use as directed 1 spray in the mouth or throat as needed (Pain).   clopidogrel 75 MG tablet Commonly known as: PLAVIX Take 75 mg by mouth daily.   CORICIDIN D COLD/FLU/SINUS PO Take 1 capsule by mouth daily as needed (Cough/Cold sx).   cyclobenzaprine 10 MG  tablet Commonly known as: FLEXERIL Take 1 tablet (10 mg total) by mouth 2 (two) times daily as needed for muscle spasms. What changed: when to take this   ezetimibe 10 MG tablet Commonly known as: ZETIA TAKE 1 TABLET (10 MG TOTAL) BY MOUTH DAILY AFTER SUPPER.   fluticasone 50 MCG/ACT nasal spray Commonly known as: FLONASE Place 2 sprays into both nostrils daily for 7 days.   loratadine 10 MG tablet Commonly known as: CLARITIN Take 1 tablet (10 mg total) by mouth daily.   meloxicam 15 MG tablet Commonly known as: MOBIC Take 15 mg by mouth daily as needed for pain.   multivitamin with minerals Tabs tablet Take 1 tablet by mouth daily.   nitroGLYCERIN 0.4 MG SL tablet Commonly known as: NITROSTAT PLACE 1 TABLET UNDER THE TONGUE EVERY 5 MINUTES AS NEEDED. FOR CHEST PAIN   pantoprazole 40 MG tablet Commonly known as: PROTONIX Take 40 mg by mouth daily.   sertraline 50 MG tablet Commonly known as: ZOLOFT Take 25 mg by mouth at bedtime.       No Known Allergies  Follow-up Information     Georgianne Fick, MD. Schedule an appointment as soon as possible  for a visit in 2 week(s).   Specialty: Internal Medicine Contact information: [address] [place] [address] [phone]                  The results of significant diagnostics from this hospitalization (including imaging, microbiology, ancillary and laboratory) are listed below for reference.    Significant Diagnostic Studies: DG Chest 2 View  Result Date: 04/29/2023 CLINICAL DATA:  Palpitations, shortness of breath, and dizziness while walking dog EXAM: CHEST - 2 VIEW COMPARISON:  Chest radiograph dated 03/19/2013 FINDINGS: Normal lung volumes. No focal consolidations. No pleural effusion or pneumothorax. The heart size and mediastinal contours are within normal limits. Median sternotomy wires are nondisplaced. IMPRESSION: No active cardiopulmonary disease. Electronically Signed   By:  Agustin Cree M.D.   On: 04/29/2023 10:45    Microbiology: Recent Results (from the past 240 hour(s))  SARS Coronavirus 2 by RT PCR (hospital order, performed in [facility] hospital lab) *cepheid single result test* Anterior Nasal Swab     Status: None   Collection Time: 04/30/23 12:08 AM   Specimen: Anterior Nasal Swab  Result Value Ref Range Status   SARS Coronavirus 2 by RT PCR NEGATIVE NEGATIVE Final    Comment: Performed at [facility], [address], [place], [place] [address]  Respiratory (~20 pathogens) panel by PCR     Status: Abnormal   Collection Time: 04/30/23 12:08 AM   Specimen: Anterior Nasal Swab; Respiratory  Result Value Ref Range Status   Adenovirus NOT DETECTED NOT DETECTED Final   Coronavirus 229E NOT DETECTED NOT DETECTED Final    Comment: (NOTE) The Coronavirus on the Respiratory Panel, DOES NOT test for the novel  Coronavirus (2019 nCoV)    Coronavirus HKU1 NOT DETECTED NOT DETECTED Final   Coronavirus NL63 NOT DETECTED NOT DETECTED Final   Coronavirus OC43 NOT DETECTED NOT DETECTED Final   Metapneumovirus NOT DETECTED NOT DETECTED Final   Rhinovirus / Enterovirus DETECTED (A) NOT DETECTED Final   Influenza A NOT DETECTED NOT DETECTED Final   Influenza B NOT DETECTED NOT DETECTED Final   Parainfluenza Virus 1 NOT DETECTED NOT DETECTED Final   Parainfluenza Virus 2 NOT DETECTED NOT DETECTED Final   Parainfluenza Virus 3 NOT DETECTED NOT DETECTED Final   Parainfluenza Virus 4 NOT DETECTED NOT DETECTED Final   Respiratory Syncytial Virus NOT DETECTED NOT DETECTED Final   Bordetella pertussis NOT DETECTED NOT DETECTED Final   Bordetella Parapertussis NOT DETECTED NOT DETECTED Final   Chlamydophila pneumoniae NOT DETECTED NOT DETECTED Final   Mycoplasma pneumoniae NOT DETECTED NOT DETECTED Final    Comment: Performed at [facility], [address] [place], [place] [address]     Labs: Basic Metabolic Panel: Recent Labs  Lab 04/29/23 1000  04/30/23 0347 05/01/23 0421  NA 139 139 139  K 3.5 4.3 3.7  CL 108 109 108  CO2 21* 21* 25  GLUCOSE 111* 98 93  BUN 10 10 7*  CREATININE 0.83 0.77 0.69  CALCIUM 9.0 9.1 8.9  MG 1.9 2.5* 2.1   Liver Function Tests: Recent Labs  Lab 04/30/23 0347  AST 36  ALT 48*  ALKPHOS 86  BILITOT 1.2*  PROT 7.0  ALBUMIN 3.5   No results for input(s): "LIPASE", "AMYLASE" in the last 168 hours. No results for input(s): "AMMONIA" in the last 168 hours. CBC: Recent Labs  Lab 04/29/23 1000  WBC 5.9  NEUTROABS 3.7  HGB 15.7  HCT  45.4  MCV 94.6  PLT 229   Cardiac Enzymes: No results for input(s): "CKTOTAL", "CKMB", "CKMBINDEX", "TROPONINI" in the last 168 hours. BNP: BNP (last 3 results) No results for input(s): "BNP" in the last 8760 hours.  ProBNP (last 3 results) No results for input(s): "PROBNP" in the last 8760 hours.  CBG: No results for input(s): "GLUCAP" in the last 168 hours.     Signed:  Ramiro Harvest MD.  [facility] 05/01/2023, 11:43 AM

## 2023-05-11 DIAGNOSIS — I2510 Atherosclerotic heart disease of native coronary artery without angina pectoris: Secondary | ICD-10-CM | POA: Diagnosis not present

## 2023-05-14 DIAGNOSIS — I10 Essential (primary) hypertension: Secondary | ICD-10-CM | POA: Diagnosis not present

## 2023-05-14 DIAGNOSIS — R000 Tachycardia, unspecified: Secondary | ICD-10-CM | POA: Diagnosis not present

## 2023-05-14 DIAGNOSIS — I25118 Atherosclerotic heart disease of native coronary artery with other forms of angina pectoris: Secondary | ICD-10-CM | POA: Diagnosis not present

## 2023-05-14 DIAGNOSIS — Z23 Encounter for immunization: Secondary | ICD-10-CM | POA: Diagnosis not present

## 2023-05-14 DIAGNOSIS — E782 Mixed hyperlipidemia: Secondary | ICD-10-CM | POA: Diagnosis not present

## 2023-05-14 DIAGNOSIS — R7303 Prediabetes: Secondary | ICD-10-CM | POA: Diagnosis not present

## 2023-05-14 DIAGNOSIS — R9431 Abnormal electrocardiogram [ECG] [EKG]: Secondary | ICD-10-CM | POA: Diagnosis not present

## 2023-05-19 ENCOUNTER — Other Ambulatory Visit: Payer: Self-pay

## 2023-05-19 DIAGNOSIS — E7800 Pure hypercholesterolemia, unspecified: Secondary | ICD-10-CM

## 2023-05-19 MED ORDER — EZETIMIBE 10 MG PO TABS: 10.0000 mg | ORAL_TABLET | Freq: Every day | ORAL | 2 refills | Status: AC

## 2023-07-31 ENCOUNTER — Other Ambulatory Visit: Payer: Self-pay | Admitting: Cardiology

## 2024-03-24 DIAGNOSIS — Z125 Encounter for screening for malignant neoplasm of prostate: Secondary | ICD-10-CM | POA: Diagnosis not present

## 2024-03-24 DIAGNOSIS — I10 Essential (primary) hypertension: Secondary | ICD-10-CM | POA: Diagnosis not present

## 2024-03-24 DIAGNOSIS — R5383 Other fatigue: Secondary | ICD-10-CM | POA: Diagnosis not present

## 2024-03-24 DIAGNOSIS — I25118 Atherosclerotic heart disease of native coronary artery with other forms of angina pectoris: Secondary | ICD-10-CM | POA: Diagnosis not present

## 2024-03-24 DIAGNOSIS — R7303 Prediabetes: Secondary | ICD-10-CM | POA: Diagnosis not present

## 2024-03-24 DIAGNOSIS — E782 Mixed hyperlipidemia: Secondary | ICD-10-CM | POA: Diagnosis not present

## 2024-03-31 DIAGNOSIS — E782 Mixed hyperlipidemia: Secondary | ICD-10-CM | POA: Diagnosis not present

## 2024-03-31 DIAGNOSIS — Z0000 Encounter for general adult medical examination without abnormal findings: Secondary | ICD-10-CM | POA: Diagnosis not present

## 2024-03-31 DIAGNOSIS — R5383 Other fatigue: Secondary | ICD-10-CM | POA: Diagnosis not present

## 2024-03-31 DIAGNOSIS — L299 Pruritus, unspecified: Secondary | ICD-10-CM | POA: Diagnosis not present

## 2024-03-31 DIAGNOSIS — R7303 Prediabetes: Secondary | ICD-10-CM | POA: Diagnosis not present

## 2024-03-31 DIAGNOSIS — I10 Essential (primary) hypertension: Secondary | ICD-10-CM | POA: Diagnosis not present

## 2024-03-31 DIAGNOSIS — Z23 Encounter for immunization: Secondary | ICD-10-CM | POA: Diagnosis not present

## 2024-03-31 DIAGNOSIS — I25118 Atherosclerotic heart disease of native coronary artery with other forms of angina pectoris: Secondary | ICD-10-CM | POA: Diagnosis not present

## 2024-03-31 DIAGNOSIS — R270 Ataxia, unspecified: Secondary | ICD-10-CM | POA: Diagnosis not present

## 2024-03-31 DIAGNOSIS — R0981 Nasal congestion: Secondary | ICD-10-CM | POA: Diagnosis not present

## 2024-04-13 ENCOUNTER — Encounter: Payer: Self-pay | Admitting: Gastroenterology

## 2024-04-13 DIAGNOSIS — R0981 Nasal congestion: Secondary | ICD-10-CM | POA: Diagnosis not present

## 2024-04-13 DIAGNOSIS — J342 Deviated nasal septum: Secondary | ICD-10-CM | POA: Diagnosis not present

## 2024-04-13 DIAGNOSIS — R42 Dizziness and giddiness: Secondary | ICD-10-CM | POA: Diagnosis not present

## 2024-04-20 DIAGNOSIS — F321 Major depressive disorder, single episode, moderate: Secondary | ICD-10-CM | POA: Diagnosis not present

## 2024-06-03 ENCOUNTER — Ambulatory Visit: Admitting: Gastroenterology

## 2024-09-08 ENCOUNTER — Ambulatory Visit: Admitting: Neurology
